# Patient Record
Sex: Female | Born: 1995 | Race: Black or African American | Hispanic: No | Marital: Single | Smoking: Never smoker
Health system: Southern US, Community
[De-identification: ages and names within clinical notes are randomized; demographics above are authoritative.]

## PROBLEM LIST (undated history)

## (undated) ENCOUNTER — Ambulatory Visit (HOSPITAL_COMMUNITY): Discharge status: Absent without leave | Payer: BLUE CROSS/BLUE SHIELD

## (undated) DIAGNOSIS — F32A Depression, unspecified: Secondary | ICD-10-CM

## (undated) HISTORY — PX: NO PAST SURGERIES: SHX2092

---

## 2018-04-29 ENCOUNTER — Encounter: Payer: Self-pay | Admitting: Endocrinology

## 2019-02-20 ENCOUNTER — Other Ambulatory Visit: Payer: Self-pay

## 2019-02-20 DIAGNOSIS — Z20822 Contact with and (suspected) exposure to covid-19: Secondary | ICD-10-CM

## 2019-02-21 LAB — NOVEL CORONAVIRUS, NAA: SARS-CoV-2, NAA: NOT DETECTED

## 2019-03-13 ENCOUNTER — Other Ambulatory Visit: Payer: Self-pay

## 2019-03-13 DIAGNOSIS — Z20822 Contact with and (suspected) exposure to covid-19: Secondary | ICD-10-CM

## 2019-03-14 LAB — NOVEL CORONAVIRUS, NAA: SARS-CoV-2, NAA: NOT DETECTED

## 2019-09-21 ENCOUNTER — Ambulatory Visit (HOSPITAL_COMMUNITY)
Admission: EM | Admit: 2019-09-21 | Discharge: 2019-09-21 | Disposition: A | Discharge status: Home / Self Care | Payer: BLUE CROSS/BLUE SHIELD

## 2019-09-21 ENCOUNTER — Encounter (HOSPITAL_COMMUNITY): Payer: Self-pay

## 2019-09-21 ENCOUNTER — Other Ambulatory Visit: Payer: Self-pay

## 2019-09-21 DIAGNOSIS — X501XXA Overexertion from prolonged static or awkward postures, initial encounter: Secondary | ICD-10-CM | POA: Diagnosis not present

## 2019-09-21 DIAGNOSIS — S92355A Nondisplaced fracture of fifth metatarsal bone, left foot, initial encounter for closed fracture: Secondary | ICD-10-CM

## 2019-09-21 DIAGNOSIS — Y939 Activity, unspecified: Secondary | ICD-10-CM | POA: Diagnosis not present

## 2019-09-21 MED ORDER — IBUPROFEN 800 MG PO TABS
800.0000 mg | ORAL_TABLET | Freq: Once | ORAL | Status: AC
  Administered 2019-09-21: 800 mg via ORAL

## 2019-09-21 MED ORDER — ACETAMINOPHEN 325 MG PO TABS: 975.0000 mg | ORAL_TABLET | Freq: Once | ORAL | Status: DC

## 2019-09-21 MED ORDER — IBUPROFEN 800 MG PO TABS
ORAL_TABLET | ORAL | Status: AC
  Filled 2019-09-21: qty 1

## 2019-09-21 MED ORDER — ACETAMINOPHEN 325 MG PO TABS
ORAL_TABLET | ORAL | Status: AC
  Filled 2019-09-21: qty 3

## 2019-09-21 MED ORDER — ACETAMINOPHEN 325 MG PO TABS
975.0000 mg | ORAL_TABLET | Freq: Once | ORAL | Status: AC
  Administered 2019-09-21: 975 mg via ORAL

## 2019-09-21 MED ORDER — ACETAMINOPHEN 500 MG PO TABS: 1000.0000 mg | ORAL_TABLET | Freq: Three times a day (TID) | ORAL | 0 refills | Status: DC | PRN

## 2019-09-21 MED ORDER — IBUPROFEN 800 MG PO TABS: 800.0000 mg | ORAL_TABLET | Freq: Once | ORAL | Status: DC

## 2019-09-21 NOTE — Discharge Instructions (Signed)
Please use the crutches at all time and wear the splint until you see the orthopedic group  Please follow-up with the [facility] group that your school gave you or the attached group.  Take Tylenol for your pain as prescribed.

## 2019-09-21 NOTE — ED Triage Notes (Signed)
Pt is here with a left foot injury that happened yesterday. States she was exercising at a park & she landed wrong on her foot, this morning she could not bare any weight on it.

## 2019-09-21 NOTE — ED Provider Notes (Signed)
[facility]    CSN: [id number] Arrival date & time: 09/21/19  1204      History   Chief Complaint Chief Complaint  Patient presents with  . Foot Injury    Left    HPI Krista Schroeder is a 24 y.o. female.   Patient presents to urgent care for acute pain in her left foot after a fall yesterday.  She reports she was working out walking down some steps and rolled her left ankle.  She had immediate pain on the outside of her foot.  She went to her school health clinic where she had x-rays and was not notified of fracture until today.  Patient's not been on to bear weight since yesterday.  School clinic was called and was able to discuss radiologic findings with x-ray technician on was notified of nondisplaced transverse fracture of the proximal fifth metatarsal.     History reviewed. No pertinent past medical history.  There are no problems to display for this patient.   History reviewed. No pertinent surgical history.  OB History   No obstetric history on file.      Home Medications    Prior to Admission medications   Medication Sig Start Date End Date Taking? Authorizing Provider  sertraline (ZOLOFT) 50 MG tablet Take 50 mg by mouth daily.   Yes [provider]  acetaminophen (TYLENOL) 500 MG tablet Take 2 tablets (1,000 mg total) by mouth every 8 (eight) hours as needed for mild pain or moderate pain. 09/21/19   Ricki Vanhandel, Veryl Speak, PA-C    Family History Family History  Problem Relation Age of Onset  . Healthy Mother   . Diabetes Father     Social History Social History   Tobacco Use  . Smoking status: Never Smoker  . Smokeless tobacco: Never Used  Substance Use Topics  . Alcohol use: Never  . Drug use: Never     Allergies   Patient has no known allergies.   Review of Systems Review of Systems  Musculoskeletal: Positive for gait problem.       Foot pain see HPI     Physical Exam Triage Vital Signs ED Triage Vitals  Enc Vitals  Group     BP 09/21/19 1246 102/69     Pulse Rate 09/21/19 1246 70     Resp 09/21/19 1246 17     Temp 09/21/19 1246 98.8 F (37.1 C)     Temp Source 09/21/19 1246 Oral     SpO2 09/21/19 1246 100 %     Weight --      Height --      Head Circumference --      Peak Flow --      Pain Score 09/21/19 1247 10     Pain Loc --      Pain Edu? --      Excl. in GC? --    No data found.  Updated Vital Signs BP 102/69 (BP Location: Right Arm)   Pulse 70   Temp 98.8 F (37.1 C) (Oral)   Resp 17   LMP 08/29/2019 (Exact Date)   SpO2 100%   Visual Acuity Right Eye Distance:   Left Eye Distance:   Bilateral Distance:    Right Eye Near:   Left Eye Near:    Bilateral Near:     Physical Exam Vitals and nursing note reviewed.  Constitutional:      General: She is not in acute distress.  Appearance: She is well-developed.  HENT:     Head: Normocephalic and atraumatic.  Eyes:     Conjunctiva/sclera: Conjunctivae normal.  Cardiovascular:     Rate and Rhythm: Normal rate.  Pulmonary:     Effort: Pulmonary effort is normal. No respiratory distress.  Musculoskeletal:     Cervical back: Neck supple.     Comments: Left foot - visible swelling ecchymosis over proximal fifth metatarsal with tenderness.  No malleoli  tenderness or swelling  Skin:    General: Skin is warm and dry.     Capillary Refill: Capillary refill takes less than 2 seconds.  Neurological:     Mental Status: She is alert.     Comments: Sensation intact distal to injury in digits of left foot      UC Treatments / Results  Labs (all labs ordered are listed, but only abnormal results are displayed) Labs Reviewed - No data to display  EKG   Radiology No results found.  Procedures Procedures (including critical care time)  Medications Ordered in UC Medications  acetaminophen (TYLENOL) tablet 975 mg (975 mg Oral Given 09/21/19 1420)  ibuprofen (ADVIL) tablet 800 mg (800 mg Oral Given 09/21/19 1420)     Initial Impression / Assessment and Plan / UC Course  I have reviewed the triage vital signs and the nursing notes.  Pertinent labs & imaging results that were available during my care of the patient were reviewed by me and considered in my medical decision making (see chart for details).      #Nondisplaced fracture of proximal fifth metatarsal Patient is a 24 year old female presenting with acute left foot pain.  I was able to speak with school clinic and confirm x-ray findings from yesterday of nondisplaced fracture of fifth metatarsal.  Patient given acetaminophen and ibuprofen in clinic for pain management.  She was splinted in a posterior splint and placed on crutches for nonweightbearing.  Instructed patient to follow-up with orthopedic clinic recommended by her school clinic or option supplied by this clinic.  She agrees and will call today.  Recommended Tylenol and ibuprofen for pain management. -Remain nonweightbearing -Tylenol and ibuprofen for pain management Final Clinical Impressions(s) / UC Diagnoses   Final diagnoses:  Closed nondisplaced fracture of fifth metatarsal bone of left foot, initial encounter     Discharge Instructions     Please use the crutches at all time and wear the splint until you see the orthopedic group  Please follow-up with the [facility] group that your school gave you or the attached group.  Take Tylenol for your pain as prescribed.    ED Prescriptions    Medication Sig Dispense Auth. Provider   acetaminophen (TYLENOL) 500 MG tablet Take 2 tablets (1,000 mg total) by mouth every 8 (eight) hours as needed for mild pain or moderate pain. 30 tablet Quantavia Frith, Marguerita Beards, PA-C     PDMP not reviewed this encounter.   Purnell Shoemaker, PA-C 09/21/19 1553

## 2019-09-21 NOTE — Progress Notes (Signed)
Orthopedic Tech Progress Note Patient Details:  Krista Schroeder 12-20-1995 [id number]  Ortho Devices Type of Ortho Device: Crutches, Short leg splint Ortho Device/Splint Location: LLE Ortho Device/Splint Interventions: Ordered, Application   Post Interventions Patient Tolerated: Well, Ambulated well Instructions Provided: Care of device, Adjustment of device   Donald Pore 09/21/2019, 2:34 PM

## 2019-10-07 ENCOUNTER — Ambulatory Visit: Payer: BLUE CROSS/BLUE SHIELD | Attending: Internal Medicine

## 2019-10-14 ENCOUNTER — Ambulatory Visit: Payer: BLUE CROSS/BLUE SHIELD | Attending: Internal Medicine

## 2019-10-16 ENCOUNTER — Ambulatory Visit: Payer: BLUE CROSS/BLUE SHIELD | Attending: Family

## 2019-10-16 DIAGNOSIS — Z23 Encounter for immunization: Secondary | ICD-10-CM

## 2019-10-16 NOTE — Progress Notes (Signed)
   Covid-19 Vaccination Clinic  Name:  Krista Schroeder    MRN: [id number] DOB: 06-Apr-1996  10/16/2019  [name] was observed post Covid-19 immunization for 15 minutes without incident. She was provided with Vaccine Information Sheet and instruction to access the V-Safe system.   [name] was instructed to call 911 with any severe reactions post vaccine: Marland Kitchen Difficulty breathing  . Swelling of face and throat  . A fast heartbeat  . A bad rash all over body  . Dizziness and weakness   Immunizations Administered    Name Date Dose VIS Date Route   JANSSEN COVID-19 VACCINE 10/16/2019 11:04 AM 0.5 mL 09/11/2019 Intramuscular   Manufacturer: Linwood Dibbles   Lot: [id number]   NDC: 30097-949-97

## 2020-03-08 ENCOUNTER — Encounter (HOSPITAL_BASED_OUTPATIENT_CLINIC_OR_DEPARTMENT_OTHER): Payer: Self-pay | Admitting: Emergency Medicine

## 2020-03-08 ENCOUNTER — Emergency Department (HOSPITAL_BASED_OUTPATIENT_CLINIC_OR_DEPARTMENT_OTHER)
Admission: EM | Admit: 2020-03-08 | Discharge: 2020-03-08 | Disposition: A | Discharge status: Home / Self Care | Payer: BLUE CROSS/BLUE SHIELD | Attending: Emergency Medicine | Admitting: Emergency Medicine

## 2020-03-08 ENCOUNTER — Other Ambulatory Visit: Payer: Self-pay

## 2020-03-08 ENCOUNTER — Emergency Department (HOSPITAL_BASED_OUTPATIENT_CLINIC_OR_DEPARTMENT_OTHER): Payer: BLUE CROSS/BLUE SHIELD

## 2020-03-08 DIAGNOSIS — Y9389 Activity, other specified: Secondary | ICD-10-CM | POA: Insufficient documentation

## 2020-03-08 DIAGNOSIS — Y998 Other external cause status: Secondary | ICD-10-CM | POA: Diagnosis not present

## 2020-03-08 DIAGNOSIS — S59901A Unspecified injury of right elbow, initial encounter: Secondary | ICD-10-CM | POA: Insufficient documentation

## 2020-03-08 DIAGNOSIS — W1789XA Other fall from one level to another, initial encounter: Secondary | ICD-10-CM | POA: Diagnosis not present

## 2020-03-08 DIAGNOSIS — Y92811 Bus as the place of occurrence of the external cause: Secondary | ICD-10-CM | POA: Diagnosis not present

## 2020-03-08 DIAGNOSIS — M25521 Pain in right elbow: Secondary | ICD-10-CM

## 2020-03-08 MED ORDER — ACETAMINOPHEN 500 MG PO TABS
1000.0000 mg | ORAL_TABLET | Freq: Once | ORAL | Status: AC
  Administered 2020-03-08: 1000 mg via ORAL
  Filled 2020-03-08: qty 2

## 2020-03-08 MED ORDER — OXYCODONE HCL 5 MG PO TABS
5.0000 mg | ORAL_TABLET | Freq: Once | ORAL | Status: AC
  Administered 2020-03-08: 5 mg via ORAL
  Filled 2020-03-08: qty 1

## 2020-03-08 NOTE — ED Provider Notes (Signed)
[facility] EMERGENCY DEPARTMENT Provider Note   CSN: [id number] Arrival date & time: 03/08/20  0831     History Chief Complaint  Patient presents with  . Arm Injury    Krista Schroeder is a 24 y.o. female.  24 yo F with a chief complaints of right arm pain.  The patient was getting off a bus and she lost her balance and fell onto a flexed arm at the elbow.  She is now having pain from the elbow down to the wrist.  She denies head injury denies loss consciousness denies neck pain chest pain abdominal pain.  She denies pain in the shoulder or the upper arm.  The history is provided by the patient.  Arm Injury Location:  Elbow Elbow location:  R elbow Injury: yes   Time since incident:  2 days Pain details:    Quality:  Aching   Radiates to:  Does not radiate   Severity:  Moderate   Onset quality:  Sudden   Duration:  1 hour   Timing:  Constant   Progression:  Worsening Handedness:  Right-handed Prior injury to area:  No Relieved by:  Nothing Worsened by:  Bearing weight, exercise and movement Ineffective treatments:  None tried Associated symptoms: no fever        History reviewed. No pertinent past medical history.  There are no problems to display for this patient.   History reviewed. No pertinent surgical history.   OB History   No obstetric history on file.     Family History  Problem Relation Age of Onset  . Healthy Mother   . Diabetes Father     Social History   Tobacco Use  . Smoking status: Never Smoker  . Smokeless tobacco: Never Used  Substance Use Topics  . Alcohol use: Never  . Drug use: Never    Home Medications Prior to Admission medications   Not on File    Allergies    Patient has no known allergies.  Review of Systems   Review of Systems  Constitutional: Negative for chills and fever.  HENT: Negative for congestion and rhinorrhea.   Eyes: Negative for redness and visual disturbance.  Respiratory: Negative for  shortness of breath and wheezing.   Cardiovascular: Negative for chest pain and palpitations.  Gastrointestinal: Negative for nausea and vomiting.  Genitourinary: Negative for dysuria and urgency.  Musculoskeletal: Positive for arthralgias and myalgias.  Skin: Negative for pallor and wound.  Neurological: Negative for dizziness and headaches.    Physical Exam Updated Vital Signs BP 112/83 (BP Location: Left Arm)   Pulse 77   Temp 98.1 F (36.7 C) (Oral)   Resp 16   Ht 5\' 3"  (1.6 m)   Wt 81.6 kg   LMP 01/26/2020   SpO2 99%   BMI 31.89 kg/m   Physical Exam Vitals and nursing note reviewed.  Constitutional:      General: She is not in acute distress.    Appearance: She is well-developed. She is not diaphoretic.  HENT:     Head: Normocephalic and atraumatic.  Eyes:     Pupils: Pupils are equal, round, and reactive to light.  Cardiovascular:     Rate and Rhythm: Normal rate and regular rhythm.     Heart sounds: No murmur heard.  No friction rub. No gallop.   Pulmonary:     Effort: Pulmonary effort is normal.     Breath sounds: No wheezing or rales.  Abdominal:  General: There is no distension.     Palpations: Abdomen is soft.     Tenderness: There is no abdominal tenderness.  Musculoskeletal:        General: Tenderness present.     Cervical back: Normal range of motion and neck supple.     Comments: No obvious signs of injury to the right arm.  She does have tenderness from the lateral epicondyle down to the wrist.  No break in the skin.  Pulse motor and sensation intact distally.  No pain at the shoulder or the clavicle or humerus.  Palpated from head to toe without any other noted areas of bony tenderness.  Skin:    General: Skin is warm and dry.  Neurological:     Mental Status: She is alert and oriented to person, place, and time.  Psychiatric:        Behavior: Behavior normal.     ED Results / Procedures / Treatments   Labs (all labs ordered are listed,  but only abnormal results are displayed) Labs Reviewed - No data to display  EKG None  Radiology DG Elbow Complete Right  Result Date: 03/08/2020 CLINICAL DATA:  Right elbow pain after fall EXAM: RIGHT ELBOW - COMPLETE 3+ VIEW COMPARISON:  None. FINDINGS: There is no evidence of fracture, dislocation, or joint effusion. There is no evidence of arthropathy or other focal bone abnormality. Soft tissues are unremarkable. IMPRESSION: Negative. Electronically Signed   By: Marnee Spring M.D.   On: 03/08/2020 09:10   DG Forearm Right  Result Date: 03/08/2020 CLINICAL DATA:  Radiating elbow and forearm pain after injury. EXAM: RIGHT FOREARM - 2 VIEW COMPARISON:  None. FINDINGS: There is no evidence of fracture or other focal bone lesions. Soft tissues are unremarkable. IMPRESSION: Negative. Electronically Signed   By: Marnee Spring M.D.   On: 03/08/2020 09:11    Procedures Procedures (including critical care time)  Medications Ordered in ED Medications  acetaminophen (TYLENOL) tablet 1,000 mg (1,000 mg Oral Given 03/08/20 0852)  oxyCODONE (Oxy IR/ROXICODONE) immediate release tablet 5 mg (5 mg Oral Given 03/08/20 7253)    ED Course  I have reviewed the triage vital signs and the nursing notes.  Pertinent labs & imaging results that were available during my care of the patient were reviewed by me and considered in my medical decision making (see chart for details).    MDM Rules/Calculators/A&P                          24 yo F with a chief complaints of a fall.  Nonsyncopal by history.  Patient complaining only of right elbow pain that radiates down to the wrist.  Will obtain a plain film of the right elbow and forearm.  Plain film viewed by me without fracture.  Patient was already given a sling.  We will have her follow-up with her family doctor.  9:16 AM:  I have discussed the diagnosis/risks/treatment options with the patient and believe the pt to be eligible for discharge home to  follow-up with PCP. We also discussed returning to the ED immediately if new or worsening sx occur. We discussed the sx which are most concerning (e.g., sudden worsening pain, fever, inability to tolerate by mouth) that necessitate immediate return. Medications administered to the patient during their visit and any new prescriptions provided to the patient are listed below.  Medications given during this visit Medications  acetaminophen (TYLENOL) tablet 1,000 mg (1,000  mg Oral Given 03/08/20 0852)  oxyCODONE (Oxy IR/ROXICODONE) immediate release tablet 5 mg (5 mg Oral Given 03/08/20 3300)     The patient appears reasonably screen and/or stabilized for discharge and I doubt any other medical condition or other [facility] requiring further screening, evaluation, or treatment in the ED at this time prior to discharge.   Final Clinical Impression(s) / ED Diagnoses Final diagnoses:  Right elbow pain    Rx / DC Orders ED Discharge Orders    None       Melene Plan, DO 03/08/20 [phone]

## 2020-03-08 NOTE — ED Triage Notes (Signed)
Pt fell while getting out of a bus, falling onto right elbow.  Pain from elbow to 5th finger.

## 2020-03-08 NOTE — ED Notes (Signed)
ED Provider at bedside. 

## 2020-03-08 NOTE — Discharge Instructions (Signed)
The x-ray of your right arm did not show fracture.  Please follow-up with your family doctor in the office.  You can release the sling for comfort but should take your arm out of the sling at least 4 times a day.  Take 4 over the counter ibuprofen tablets 3 times a day or 2 over-the-counter naproxen tablets twice a day for pain. Also take tylenol 1000mg (2 extra strength) four times a day.

## 2020-08-15 ENCOUNTER — Ambulatory Visit: Payer: BLUE CROSS/BLUE SHIELD | Attending: Internal Medicine

## 2020-08-15 DIAGNOSIS — Z23 Encounter for immunization: Secondary | ICD-10-CM

## 2020-08-15 NOTE — Progress Notes (Signed)
   Covid-19 Vaccination Clinic  Name:  Krista Schroeder    MRN: [id number] DOB: [birth year]  08/15/2020  Krista Schroeder was observed post Covid-19 immunization for 15 minutes without incident. She was provided with Vaccine Information Sheet and instruction to access the V-Safe system.   Krista Schroeder was instructed to call 911 with any severe reactions post vaccine: Marland Kitchen Difficulty breathing  . Swelling of face and throat  . A fast heartbeat  . A bad rash all over body  . Dizziness and weakness   Immunizations Administered    Name Date Dose VIS Date Route   Moderna Covid-19 Booster Vaccine 08/15/2020  1:50 PM 0.25 mL 05/03/2020 Intramuscular   Manufacturer: Gala Murdoch   Lot: [id number]   NDC: 96886-484-72

## 2020-10-05 ENCOUNTER — Other Ambulatory Visit: Payer: Self-pay

## 2020-10-05 ENCOUNTER — Emergency Department (HOSPITAL_COMMUNITY)
Admission: EM | Admit: 2020-10-05 | Discharge: 2020-10-05 | Disposition: A | Discharge status: Other Acute Inpatient Hospital | Payer: BLUE CROSS/BLUE SHIELD | Source: Home / Self Care | Attending: Emergency Medicine | Admitting: Emergency Medicine

## 2020-10-05 ENCOUNTER — Other Ambulatory Visit: Payer: Self-pay | Admitting: Psychiatry

## 2020-10-05 ENCOUNTER — Encounter (HOSPITAL_COMMUNITY): Payer: Self-pay

## 2020-10-05 DIAGNOSIS — Z20822 Contact with and (suspected) exposure to covid-19: Secondary | ICD-10-CM | POA: Insufficient documentation

## 2020-10-05 DIAGNOSIS — F332 Major depressive disorder, recurrent severe without psychotic features: Secondary | ICD-10-CM | POA: Diagnosis not present

## 2020-10-05 DIAGNOSIS — S61511A Laceration without foreign body of right wrist, initial encounter: Secondary | ICD-10-CM | POA: Insufficient documentation

## 2020-10-05 DIAGNOSIS — Z7289 Other problems related to lifestyle: Secondary | ICD-10-CM

## 2020-10-05 DIAGNOSIS — X781XXA Intentional self-harm by knife, initial encounter: Secondary | ICD-10-CM | POA: Insufficient documentation

## 2020-10-05 DIAGNOSIS — R45851 Suicidal ideations: Secondary | ICD-10-CM

## 2020-10-05 HISTORY — DX: Depression, unspecified: F32.A

## 2020-10-05 LAB — SALICYLATE LEVEL: Salicylate Lvl: <7 mg/dL — ABNORMAL LOW (ref 7.0–30.0)

## 2020-10-05 LAB — COMPREHENSIVE METABOLIC PANEL
ALT: 19 U/L (ref 0–44)
AST: 29 U/L (ref 15–41)
Albumin: 3.9 g/dL (ref 3.5–5.0)
Alkaline Phosphatase: 73 U/L (ref 38–126)
Anion gap: 8 (ref 5–15)
BUN: 7 mg/dL (ref 6–20)
CO2: 22 mmol/L (ref 22–32)
Calcium: 9.1 mg/dL (ref 8.9–10.3)
Chloride: 110 mmol/L (ref 98–111)
Creatinine, Ser: 0.74 mg/dL (ref 0.44–1.00)
GFR, Estimated: >60 mL/min (ref >60)
Glucose, Bld: 101 mg/dL — ABNORMAL HIGH (ref 70–99)
Potassium: 4 mmol/L (ref 3.5–5.1)
Sodium: 140 mmol/L (ref 135–145)
Total Bilirubin: 0.8 mg/dL (ref 0.3–1.2)
Total Protein: 7.7 g/dL (ref 6.5–8.1)

## 2020-10-05 LAB — RAPID URINE DRUG SCREEN, HOSP PERFORMED
Amphetamines: NOT DETECTED
Barbiturates: NOT DETECTED
Benzodiazepines: NOT DETECTED
Cocaine: NOT DETECTED
Opiates: NOT DETECTED
Tetrahydrocannabinol: NOT DETECTED

## 2020-10-05 LAB — CBC
HCT: 37.6 % (ref 36.0–46.0)
Hemoglobin: 12.2 g/dL (ref 12.0–15.0)
MCH: 27.7 pg (ref 26.0–34.0)
MCHC: 32.4 g/dL (ref 30.0–36.0)
MCV: 85.3 fL (ref 80.0–100.0)
Platelets: 310 10*3/uL (ref 150–400)
RBC: 4.41 MIL/uL (ref 3.87–5.11)
RDW: 13.1 % (ref 11.5–15.5)
WBC: 4 10*3/uL (ref 4.0–10.5)
nRBC: 0 % (ref 0.0–0.2)

## 2020-10-05 LAB — RESP PANEL BY RT-PCR (FLU A&B, COVID) ARPGX2
Influenza A by PCR: NEGATIVE
Influenza B by PCR: NEGATIVE
SARS Coronavirus 2 by RT PCR: NEGATIVE

## 2020-10-05 LAB — PREGNANCY, URINE: Preg Test, Ur: NEGATIVE

## 2020-10-05 LAB — ACETAMINOPHEN LEVEL: Acetaminophen (Tylenol), Serum: <10 ug/mL — ABNORMAL LOW (ref 10–30)

## 2020-10-05 LAB — ETHANOL: Alcohol, Ethyl (B): <10 mg/dL (ref <10)

## 2020-10-05 NOTE — ED Notes (Signed)
2 patient belonging bags placed behind Triage nursing station.

## 2020-10-05 NOTE — ED Triage Notes (Signed)
Patient states she is suicidal and had a knife last night. Patient has a superficial mark to the right inner wrist and states she attempted to kill herself last night. Patient tearful in triage. Patient denies HI, auditory or visual hallucinations. Patient denies drug or alcohol use.

## 2020-10-05 NOTE — BH Assessment (Signed)
Per Rosezetta Schlatter , RN , [facility] accepted patient to [id number] per Dr Jola Babinski . Arrival time TBD by night time AC. Please wait for instructions from night time [facility]  before calling report.
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
## 2020-10-05 NOTE — BH Assessment (Signed)
Writer informed the nurse that the patient is able to come to [facility] at 11pm.   Per Rosezetta Schlatter , RN , [facility] at [facility] accepted patient to [phone] per Dr Jola Babinski .

## 2020-10-05 NOTE — ED Provider Notes (Addendum)
[facility]-EMERGENC[facility] DEPT Provider Note   CSN: [id number] Arrival date & time: 10/05/20  1230     History Chief Complaint  Patient presents with  . Suicidal    Krista Schroeder is a 25 y.o. female presenting for evaluation of SI.   Pt states she is having suicidal thoughts. She cut her R wrist with a knife last night due to this.  She has not had anything else in attempt to hurt herself.  The suicidal thoughts are new for her.  She denies homicidal thoughts or audiovisual hallucinations.  She does see a therapist due to depression, has been off now she is felt, but does not take any medication for this.  She has never had suicidal thoughts like this before.  She denies tobacco, alcohol, or drug use.  She is no other medical problems takes no medications daily. Her friend encouraged her to come to the ED today.   Additional history obtained from chart review.  Per chart review, patient with a history of depression.   HPI     Past Medical History:  Diagnosis Date  . Depression     There are no problems to display for this patient.   History reviewed. No pertinent surgical history.   OB History   No obstetric history on file.     Family History  Problem Relation Age of Onset  . Healthy Mother   . Diabetes Father     Social History   Tobacco Use  . Smoking status: Never Smoker  . Smokeless tobacco: Never Used  Vaping Use  . Vaping Use: Never used  Substance Use Topics  . Alcohol use: Never  . Drug use: Never    Home Medications Prior to Admission medications   Not on File    Allergies    Patient has no known allergies.  Review of Systems   Review of Systems  Psychiatric/Behavioral: Positive for dysphoric mood, self-injury and suicidal ideas.  All other systems reviewed and are negative.   Physical Exam Updated Vital Signs BP 121/87 (BP Location: Left Arm)   Pulse 78   Temp 98.7 F (37.1 C) (Oral)   Resp 18   Ht 5\' 3"  (1.6  m)   Wt 90.7 kg   LMP 09/27/2020   SpO2 99%   BMI 35.43 kg/m   Physical Exam Vitals and nursing note reviewed.  Constitutional:      General: She is not in acute distress.    Appearance: She is well-developed.     Comments: Appears nontoxic  HENT:     Head: Normocephalic and atraumatic.  Eyes:     Conjunctiva/sclera: Conjunctivae normal.     Pupils: Pupils are equal, round, and reactive to light.  Cardiovascular:     Rate and Rhythm: Normal rate and regular rhythm.     Pulses: Normal pulses.  Pulmonary:     Effort: Pulmonary effort is normal. No respiratory distress.     Breath sounds: Normal breath sounds. No wheezing.  Abdominal:     General: There is no distension.     Palpations: Abdomen is soft. There is no mass.     Tenderness: There is no abdominal tenderness. There is no guarding or rebound.  Musculoskeletal:        General: Normal range of motion.     Cervical back: Normal range of motion and neck supple.  Skin:    General: Skin is warm and dry.     Capillary Refill: Capillary  refill takes less than 2 seconds.     Comments: 3 very superficial lacerations of the right wrist  Neurological:     Mental Status: She is alert and oriented to person, place, and time.  Psychiatric:        Attention and Perception: She does not perceive auditory or visual hallucinations.        Mood and Affect: Mood is depressed. Affect is tearful.        Speech: She is noncommunicative.        Behavior: Behavior is withdrawn.        Thought Content: Thought content includes suicidal ideation. Thought content does not include homicidal ideation. Thought content does not include homicidal plan.     Comments: Pt withdrawn and with tearful affect. Reports SI     ED Results / Procedures / Treatments   Labs (all labs ordered are listed, but only abnormal results are displayed) Labs Reviewed  COMPREHENSIVE METABOLIC PANEL - Abnormal; Notable for the following components:      Result Value    Glucose, Bld 101 (*)    All other components within normal limits  SALICYLATE LEVEL - Abnormal; Notable for the following components:   Salicylate Lvl <7.0 (*)    All other components within normal limits  ACETAMINOPHEN LEVEL - Abnormal; Notable for the following components:   Acetaminophen (Tylenol), Serum <10 (*)    All other components within normal limits  RESP PANEL BY RT-PCR (FLU A&B, COVID) ARPGX2  ETHANOL  CBC  RAPID URINE DRUG SCREEN, HOSP PERFORMED  I-STAT BETA HCG BLOOD, ED ([facility], [facility], [facility] ONLY)    EKG None  Radiology No results found.  Procedures Procedures   Medications Ordered in ED Medications - No data to display  ED Course  I have reviewed the triage vital signs and the nursing notes.  Pertinent labs & imaging results that were available during my care of the patient were reviewed by me and considered in my medical decision making (see chart for details).    MDM Rules/Calculators/A&P                          Patient presenting for suicidal thoughts.  On exam, patient appears nontoxic.  She is withdrawn and has tearful affect.  She does report attempted herself last night which is the first time she has done so by cutting her right wrist with a knife.  On evaluation, patient with very superficial wounds of the right volar wrist, does not require repair.  No other reported attempt to hurt herself.  At this time, patient is medically cleared for psychiatric evaluation, as she is having new/worsening SI with self-harm. She is not currently under IVC, however if pt tries to leave she will need IVC until psych team can evaluate.   The patient has been placed in psychiatric observation due to the need to provide a safe environment for the patient while obtaining psychiatric consultation and evaluation, as well as ongoing medical and medication management to treat the patient's condition.  The patient has not been placed under full IVC at this time.  [facility] team evaluated  pt, recommends inpatient tx. No home meds to order.    Final Clinical Impression(s) / ED Diagnoses Final diagnoses:  Suicidal ideation  Deliberate self-cutting    Rx / DC Orders ED Discharge Orders    None       Caccavale, Sophia, PA-C 10/05/20 1428    Caccavale,  Jeanette Caprice, PA-C 10/05/20 1513    Mancel Bale, MD 10/05/20 1600

## 2020-10-05 NOTE — ED Notes (Signed)
Transportation arranged with [facility].  

## 2020-10-06 ENCOUNTER — Inpatient Hospital Stay (HOSPITAL_COMMUNITY)
Admission: AD | Admit: 2020-10-06 | Discharge: 2020-10-09 | DRG: 885 | Disposition: A | Discharge status: Home / Self Care | Payer: BLUE CROSS/BLUE SHIELD | Source: Intra-hospital | Attending: Behavioral Health | Admitting: Behavioral Health

## 2020-10-06 ENCOUNTER — Encounter (HOSPITAL_COMMUNITY): Payer: Self-pay | Admitting: Psychiatry

## 2020-10-06 DIAGNOSIS — Z20822 Contact with and (suspected) exposure to covid-19: Secondary | ICD-10-CM | POA: Diagnosis present

## 2020-10-06 DIAGNOSIS — F332 Major depressive disorder, recurrent severe without psychotic features: Secondary | ICD-10-CM | POA: Diagnosis present

## 2020-10-06 DIAGNOSIS — G4700 Insomnia, unspecified: Secondary | ICD-10-CM | POA: Diagnosis present

## 2020-10-06 DIAGNOSIS — F339 Major depressive disorder, recurrent, unspecified: Secondary | ICD-10-CM | POA: Diagnosis present

## 2020-10-06 MED ORDER — HYDROXYZINE HCL 25 MG PO TABS
25.0000 mg | ORAL_TABLET | Freq: Three times a day (TID) | ORAL | Status: DC | PRN
  Administered 2020-10-06 – 2020-10-08 (×6): 25 mg via ORAL
  Filled 2020-10-06 (×6): qty 1

## 2020-10-06 MED ORDER — TRAZODONE HCL 50 MG PO TABS
50.0000 mg | ORAL_TABLET | Freq: Every evening | ORAL | Status: DC | PRN
  Administered 2020-10-06 – 2020-10-08 (×4): 50 mg via ORAL
  Filled 2020-10-06 (×4): qty 1

## 2020-10-06 MED ORDER — ESCITALOPRAM OXALATE 10 MG PO TABS
10.0000 mg | ORAL_TABLET | Freq: Every day | ORAL | Status: DC
  Administered 2020-10-06 – 2020-10-09 (×4): 10 mg via ORAL
  Filled 2020-10-06 (×7): qty 1

## 2020-10-06 NOTE — Progress Notes (Addendum)
Pt concerned about her school situation, pt said she was feeling better overall. 1:1 time spent with writer discussing possible things to do moving forward. Pt stated she feels more hopeful moving forward. Pt given PRN Vistaril and Trazodone per [facility]     10/06/20 2200  Psych Admission Type (Psych Patients Only)  Admission Status Voluntary  Psychosocial Assessment  Patient Complaints Anxiety;Depression  Eye Contact Fair  Facial Expression Anxious;Sullen;Sad;Worried  Affect Anxious;Depressed;Sad;Sullen  Speech Logical/coherent  Motor Activity Fidgety  Appearance/Hygiene Unremarkable  Behavior Characteristics Cooperative  Mood Depressed;Anxious  Thought Process  Coherency WDL  Content Blaming others  Delusions None reported or observed  Perception WDL  Hallucination None reported or observed  Judgment Poor  Confusion None  Danger to Self  Current suicidal ideation? Denies  Danger to Others  Danger to Others None reported or observed

## 2020-10-06 NOTE — Progress Notes (Signed)
Progress note    10/06/20 1020  Psych Admission Type (Psych Patients Only)  Admission Status Voluntary  Psychosocial Assessment  Patient Complaints Anxiety;Depression  Eye Contact Fair  Facial Expression Anxious;Sullen;Sad;Worried  Affect Anxious;Depressed;Sad;Sullen  Speech Logical/coherent  Motor Activity Fidgety  Appearance/Hygiene Unremarkable  Behavior Characteristics Cooperative;Appropriate to situation;Anxious  Mood Depressed;Anxious;Sad;Sullen;Pleasant  Thought Process  Coherency WDL  Content Blaming others  Delusions None reported or observed  Perception WDL  Hallucination None reported or observed  Judgment Poor  Confusion None  Danger to Self  Current suicidal ideation? Denies  Danger to Others  Danger to Others None reported or observed

## 2020-10-06 NOTE — Tx Team (Signed)
Interdisciplinary Treatment and Diagnostic Plan Update  10/06/2020 Time of Session: 9:35am Krista Schroeder MRN: [id number]  Principal Diagnosis: <principal problem not specified>  Secondary Diagnoses: Active Problems:   MDD (major depressive disorder), recurrent episode, severe (HCC)   Current Medications:  Current Facility-Administered Medications  Medication Dose Route Frequency Provider Last Rate Last Admin  . hydrOXYzine (ATARAX/VISTARIL) tablet 25 mg  25 mg Oral TID PRN Lindon Romp A, NP   25 mg at 10/06/20 0048  . traZODone (DESYREL) tablet 50 mg  50 mg Oral QHS PRN Lindon Romp A, NP   50 mg at 10/06/20 0048   PTA Medications: No medications prior to admission.    Patient Stressors: Network engineer difficulties Loss of 2 cousins, grandmother, friend, & family friend Marital or family conflict  Patient Strengths: Capable of independent living Curator fund of knowledge Motivation for treatment/growth Physical Health Supportive family/friends  Treatment Modalities: Medication Management, Group therapy, Case management,  1 to 1 session with clinician, Psychoeducation, Recreational therapy.   Physician Treatment Plan for Primary Diagnosis: <principal problem not specified> Long Term Goal(s):     Short Term Goals:    Medication Management: Evaluate patient's response, side effects, and tolerance of medication regimen.  Therapeutic Interventions: 1 to 1 sessions, Unit Group sessions and Medication administration.  Evaluation of Outcomes: Not Met  Physician Treatment Plan for Secondary Diagnosis: Active Problems:   MDD (major depressive disorder), recurrent episode, severe ([place])  Long Term Goal(s):     Short Term Goals:       Medication Management: Evaluate patient's response, side effects, and tolerance of medication regimen.  Therapeutic Interventions: 1 to 1 sessions, Unit Group sessions and Medication  administration.  Evaluation of Outcomes: Not Met   RN Treatment Plan for Primary Diagnosis: <principal problem not specified> Long Term Goal(s): Knowledge of disease and therapeutic regimen to maintain health will improve  Short Term Goals: Ability to demonstrate self-control, Ability to participate in decision making will improve and Ability to verbalize feelings will improve  Medication Management: RN will administer medications as ordered by provider, will assess and evaluate patient's response and provide education to patient for prescribed medication. RN will report any adverse and/or side effects to prescribing provider.  Therapeutic Interventions: 1 on 1 counseling sessions, Psychoeducation, Medication administration, Evaluate responses to treatment, Monitor vital signs and CBGs as ordered, Perform/monitor CIWA, COWS, AIMS and Fall Risk screenings as ordered, Perform wound care treatments as ordered.  Evaluation of Outcomes: Not Met   LCSW Treatment Plan for Primary Diagnosis: <principal problem not specified> Long Term Goal(s): Safe transition to appropriate next level of care at discharge, Engage patient in therapeutic group addressing interpersonal concerns.  Short Term Goals: Engage patient in aftercare planning with referrals and resources, Increase social support and Increase ability to appropriately verbalize feelings  Therapeutic Interventions: Assess for all discharge needs, 1 to 1 time with Social worker, Explore available resources and support systems, Assess for adequacy in community support network, Educate family and significant other(s) on suicide prevention, Complete Psychosocial Assessment, Interpersonal group therapy.  Evaluation of Outcomes: Not Met   Progress in Treatment: Attending groups: No. and As evidenced by:  Pt was recently admitted Participating in groups: No. and As evidenced by:  Pt was recently admitted Taking medication as prescribed:  Yes. Toleration medication: Yes. Family/Significant other contact made: No, will contact:  CSW will obtain consent Patient understands diagnosis: Yes. Discussing patient identified problems/goals with staff: Yes. Medical problems stabilized or resolved: Yes.  Denies suicidal/homicidal ideation: Yes. Issues/concerns per patient self-inventory: No. Other: None  New problem(s) identified: No, Describe:  None  New Short Term/Long Term Goal(s):medication stabilization, elimination of SI thoughts, development of comprehensive mental wellness plan.  Patient Goals:  "taking action on coping skills"  Discharge Plan or Barriers: Patient recently admitted. CSW will continue to follow and assess for appropriate referrals and possible discharge planning.  Reason for Continuation of Hospitalization: Depression Medication stabilization Suicidal ideation  Estimated Length of Stay: 3-5 days  Attendees: Patient: Krista Schroeder 10/06/2020   Physician: Dr. Myles Lipps 10/06/2020   Nursing:  10/06/2020   RN Care Manager: 10/06/2020   Social Worker: Toney Reil, [place] 10/06/2020   Recreational Therapist:  10/06/2020   Other:  10/06/2020   Other:  10/06/2020   Other: 10/06/2020       Scribe for Treatment Team: Mliss Fritz, Latanya Presser 10/06/2020 11:37 AM

## 2020-10-06 NOTE — Progress Notes (Signed)
   10/05/20 2330  Psych Admission Type (Psych Patients Only)  Admission Status Voluntary  Psychosocial Assessment  Patient Complaints Anxiety;Appetite decrease;Depression;Hopelessness;Insomnia;Panic attack;Sadness;Self-harm thoughts  Eye Contact Fair  Facial Expression Anxious  Affect Anxious;Appropriate to circumstance  Speech Logical/coherent  Interaction Assertive  Motor Activity Other (Comment) (WNL)  Appearance/Hygiene Unremarkable  Behavior Characteristics Cooperative;Appropriate to situation;Anxious  Mood Depressed;Anxious;Sad;Pleasant  Thought Process  Coherency WDL  Content Blaming others  Delusions None reported or observed  Perception WDL  Hallucination None reported or observed  Judgment Poor  Confusion None  Danger to Self  Current suicidal ideation? Denies  Danger to Others  Danger to Others None reported or observed

## 2020-10-06 NOTE — Progress Notes (Signed)
   10/06/20 0500  Sleep  Number of Hours 5

## 2020-10-06 NOTE — H&P (Signed)
Psychiatric Admission Assessment Adult  Patient Identification: Krista Schroeder  MRN:  [id number]  Date of Evaluation:  10/06/2020  Chief Complaint:  MDD (major depressive disorder), recurrent episode, severe (HCC) [F33.2]  Principal Diagnosis: MDD (major depressive disorder), recurrent episode, severe (HCC)  Diagnosis:  Principal Problem:   MDD (major depressive disorder), recurrent episode, severe (HCC)  History of Present Illness: This is the first psychiatric admission for Krista Schroeder, a 25 year old AA female with previous hx of depression & anxiety. 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 She is admitted to the [facility] from the [facility] ED with complain of suicidal ideations, suicide attempt by self-inflicted superficial laceration to inner left wrist. After medically evaluated/cleared at the ED, she was sent to the [facility] for psychiatric evaluation & treatment. During this evaluation, Krista Schroeder reports,  "My friend took me to the ED yesterday. I had made a suicide attempt a couple of hours prior. I slit my right wrist with a knife. I have been having suicidal ideations since last September, 2021. I have been grieving a lot & feeling overwhelmed dealing with my family. I know that my family cares about me, but in my head, I'm thinking they do not understand what I'm going through or my depression. I have feeling depressed since 2017. It started with bad anxiety, but no one in my family understands. Prior to starting college, my cousin died of breast cancer. After that, my friend died with her 3 other family memebrs. They were shot to death by her brother. I have been grieving hard. Then, my paternal grandmother died as well. I was treated for depression sometime last year. I was given. SRRI. I took it for a month, it made me feel weird, I stopped taking it. After I stopped the medicine, all the emotions came back & worsened. I recently heard that someone I knew from high school committed  suicide. I do not use drugs or alcohol".  Patient at this time rates her depression #2 & anxiety #1.  Associated Signs/Symptoms:  Depression Symptoms:  depressed mood, insomnia, suicidal thoughts with specific plan, anxiety,  Duration of Depression Symptoms: Greater than two weeks  (Hypo) Manic Symptoms:  Impulsivity,  Anxiety Symptoms:  Excessive Worry,  Psychotic Symptoms:  Denies  PTSD Symptoms: NA  Total Time spent with patient: 1 hour  Past Psychiatric History: Depression & anxiety since 2017.  Is the patient at risk to self? No. (Not currently)  Has the patient been a risk to self in the past 6 months? Yes.     Has the patient been a risk to self within the distant past? Yes.     Is the patient a risk to others? No.   Has the patient been a risk to others in the past 6 months? No.   Has the patient been a risk to others within the distant past? No.   Prior Inpatient Therapy: Denies Prior Outpatient Therapy: Yes  Alcohol Screening: 1. How often do you have a drink containing alcohol?: Monthly or less 2. How many drinks containing alcohol do you have on a typical day when you are drinking?: 1 or 2 3. How often do you have six or more drinks on one occasion?: Never AUDIT-C Score: 1 4. How often during the last year have you found that you were not able to stop drinking once you had started?: Never 5. How often during the last year have you failed to do what was normally expected  from you because of drinking?: Never 6. How often during the last year have you needed a first drink in the morning to get yourself going after a heavy drinking session?: Never 7. How often during the last year have you had a feeling of guilt of remorse after drinking?: Never 8. How often during the last year have you been unable to remember what happened the night before because you had been drinking?: Never 9. Have you or someone else been injured as a result of your drinking?: No 10.  Has a relative or friend or a doctor or another health worker been concerned about your drinking or suggested you cut down?: No Alcohol Use Disorder Identification Test Final Score (AUDIT): 1 Alcohol Brief Interventions/Follow-up: Alcohol Education,AUDIT Score <7 follow-up not indicated  Substance Abuse History in the last 12 months:  No.  Consequences of Substance Abuse: NA  Previous Psychotropic Medications: "Yes, I took an SSRI x 1 month, then stopped".  Psychological Evaluations: No   Past Medical History:  Past Medical History:  Diagnosis Date  . Depression     Past Surgical History:  Procedure Laterality Date  . NO PAST SURGERIES     Family History:  Family History  Problem Relation Age of Onset  . Healthy Mother   . Diabetes Father    Family Psychiatric  History: Depression: Father  Tobacco Screening: Have you used any form of tobacco in the last 30 days? 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 Social History   Substance and Sexual Activity  Alcohol Use Yes   Comment: occasionally, once every few months, 1/2 cup in amount, last drink was Saturday     Social History   Substance and Sexual Activity  Drug Use Never    Additional Social History:  Allergies:  No Known Allergies  Lab Results:  Results for orders placed or performed during the hospital encounter of 10/05/20 (from the past 48 hour(s))  Comprehensive metabolic panel     Status: Abnormal   Collection Time: 10/05/20  1:10 PM  Result Value Ref Range   Sodium 140 135 - 145 mmol/L   Potassium 4.0 3.5 - 5.1 mmol/L   Chloride 110 98 - 111 mmol/L   CO2 22 22 - 32 mmol/L   Glucose, Bld 101 (H) 70 - 99 mg/dL    Comment: Glucose reference range applies only to samples taken after fasting for at least 8 hours.   BUN 7 6 - 20 mg/dL   Creatinine, Ser 8.290.74 0.44 - 1.00 mg/dL   Calcium 9.1 8.9 - 56.210.3 mg/dL   Total Protein 7.7  6.5 - 8.1 g/dL   Albumin 3.9 3.5 - 5.0 g/dL   AST 29 15 - 41 U/L   ALT 19 0 - 44 U/L   Alkaline Phosphatase 73 38 - 126 U/L   Total Bilirubin 0.8 0.3 - 1.2 mg/dL   GFR, Estimated >13>60 >08>60 mL/min    Comment: (NOTE) Calculated using the CKD-EPI Creatinine Equation (2021)    Anion gap 8 5 - 15    Comment: Performed at [facility], [address] [place], [place] [address]  Ethanol     Status: None   Collection Time: 10/05/20  1:10 PM  Result Value Ref Range   Alcohol, Ethyl (B) <10 <10 mg/dL    Comment: (NOTE) Lowest detectable limit for serum alcohol is 10 mg/dL.  For medical purposes only. Performed at [facility], [address], [place], [place] [address]   Salicylate level     Status: Abnormal   Collection Time: 10/05/20  1:10 PM  Result Value Ref Range   Salicylate Lvl <7.0 (L) 7.0 - 30.0 mg/dL    Comment: Performed at [facility], [address] [place], [place] [address]  Acetaminophen level     Status: Abnormal   Collection Time: 10/05/20  1:10 PM  Result Value Ref Range   Acetaminophen (Tylenol), Serum <10 (L) 10 - 30 ug/mL    Comment: (NOTE) Therapeutic concentrations vary significantly. A range of 10-30 ug/mL  may be an effective concentration for many patients. However, some  are best treated at concentrations outside of this range. Acetaminophen concentrations >150 ug/mL at 4 hours after ingestion  and >50 ug/mL at 12 hours after ingestion are often associated with  toxic reactions.  Performed at [facility], [address] [place], [place] [address]   cbc     Status: None   Collection Time: 10/05/20  1:10 PM  Result Value Ref Range   WBC 4.0 4.0 - 10.5 K/uL   RBC 4.41 3.87 - 5.11 MIL/uL   Hemoglobin 12.2 12.0 - 15.0 g/dL   HCT 81.2 75.1 - 70.0 %   MCV 85.3 80.0 - 100.0 fL   MCH 27.7 26.0 - 34.0 pg   MCHC 32.4 30.0 - 36.0 g/dL   RDW 17.4 94.4 - 96.7 %   Platelets 310 150  - 400 K/uL   nRBC 0.0 0.0 - 0.2 %    Comment: Performed at [facility], [address], [place], [place] [address]  Rapid urine drug screen (hospital performed)     Status: None   Collection Time: 10/05/20  1:10 PM  Result Value Ref Range   Opiates NONE DETECTED NONE DETECTED   Cocaine NONE DETECTED NONE DETECTED   Benzodiazepines NONE DETECTED NONE DETECTED   Amphetamines NONE DETECTED NONE DETECTED   Tetrahydrocannabinol NONE DETECTED NONE DETECTED   Barbiturates NONE DETECTED NONE DETECTED    Comment: (NOTE) DRUG SCREEN FOR MEDICAL PURPOSES ONLY.  IF CONFIRMATION IS NEEDED FOR ANY PURPOSE, NOTIFY LAB WITHIN 5 DAYS.  LOWEST DETECTABLE LIMITS FOR URINE DRUG SCREEN Drug Class                     Cutoff (ng/mL) Amphetamine and metabolites    1000 Barbiturate and metabolites    200 Benzodiazepine                 200 Tricyclics and metabolites     300 Opiates and metabolites        300 Cocaine and metabolites        300 THC                            50 Performed at [facility], [address], [place], [place] [address]   Pregnancy, urine     Status: None   Collection Time: 10/05/20  1:10 PM  Result Value Ref Range   Preg Test, Ur NEGATIVE NEGATIVE    Comment:        THE SENSITIVITY OF THIS METHODOLOGY IS >20 mIU/mL. Performed at [facility], [address], [place], [place] [address]   Resp Panel by RT-PCR (Flu A&B, Covid) Nasopharyngeal Swab     Status: None   Collection Time: 10/05/20  1:57  PM   Specimen: Nasopharyngeal Swab; Nasopharyngeal(NP) swabs in vial transport medium  Result Value Ref Range   SARS Coronavirus 2 by RT PCR NEGATIVE NEGATIVE    Comment: (NOTE) SARS-CoV-2 target nucleic acids are NOT DETECTED.  The SARS-CoV-2 RNA is generally detectable in upper respiratory specimens during the acute phase of infection. The lowest concentration of SARS-CoV-2 viral copies this assay can detect is 138  copies/mL. A negative result does not preclude SARS-Cov-2 infection and should not be used as the sole basis for treatment or other patient management decisions. A negative result may occur with  improper specimen collection/handling, submission of specimen other than nasopharyngeal swab, presence of viral mutation(s) within the areas targeted by this assay, and inadequate number of viral copies(<138 copies/mL). A negative result must be combined with clinical observations, patient history, and epidemiological information. The expected result is Negative.  Fact Sheet for Patients:  BloggerCourse.com  Fact Sheet for Healthcare Providers:  SeriousBroker.it  This test is no t yet approved or cleared by the [place] FDA and  has been authorized for detection and/or diagnosis of SARS-CoV-2 by FDA under an Emergency Use Authorization (EUA). This EUA will remain  in effect (meaning this test can be used) for the duration of the COVID-19 declaration under Section 564(b)(1) of the Act, 21 U.S.C.section 360bbb-3(b)(1), unless the authorization is terminated  or revoked sooner.       Influenza A by PCR NEGATIVE NEGATIVE   Influenza B by PCR NEGATIVE NEGATIVE    Comment: (NOTE) The Xpert Xpress SARS-CoV-2/FLU/RSV plus assay is intended as an aid in the diagnosis of influenza from Nasopharyngeal swab specimens and should not be used as a sole basis for treatment. Nasal washings and aspirates are unacceptable for Xpert Xpress SARS-CoV-2/FLU/RSV testing.  Fact Sheet for Patients: BloggerCourse.com  Fact Sheet for Healthcare Providers: SeriousBroker.it  This test is not yet approved or cleared by the [place] FDA and has been authorized for detection and/or diagnosis of SARS-CoV-2 by FDA under an Emergency Use Authorization (EUA). This EUA will remain in effect (meaning this test  can be used) for the duration of the COVID-19 declaration under Section 564(b)(1) of the Act, 21 U.S.C. section 360bbb-3(b)(1), unless the authorization is terminated or revoked.  Performed at [facility], [address], [place], [place] [address]    Blood Alcohol level:  Lab Results  Component Value Date   ETH <10 10/05/2020   Metabolic Disorder Labs:  No results found for: HGBA1C, MPG No results found for: PROLACTIN No results found for: CHOL, TRIG, HDL, CHOLHDL, VLDL, LDLCALC  Current Medications: Current Facility-Administered Medications  Medication Dose Route Frequency Provider Last Rate Last Admin  . hydrOXYzine (ATARAX/VISTARIL) tablet 25 mg  25 mg Oral TID PRN Nira Conn A, NP   25 mg at 10/06/20 0048  . traZODone (DESYREL) tablet 50 mg  50 mg Oral QHS PRN Nira Conn A, NP   50 mg at 10/06/20 0048   PTA Medications: No medications prior to admission.   Musculoskeletal: Strength & Muscle Tone: within normal limits Gait & Station: normal Patient leans: N/A  Psychiatric Specialty Exam:  Presentation  General Appearance: Casual; Fairly Groomed; Appropriate for Environment  Eye Contact:Fair  Speech:Clear and Coherent; Normal Rate  Speech Volume:Normal  Handedness:Left   Mood and Affect  Mood:Depressed  Affect:Flat  Thought Process  Thought Processes:Coherent  Duration of Psychotic Symptoms: No data recorded Past Diagnosis of Schizophrenia or Psychoactive disorder: No  Descriptions of Associations:Intact  Orientation:Full (  Time, Place and Person)  Thought Content:Logical; WDL  Hallucinations:Hallucinations: None  Ideas of Reference:None  Suicidal Thoughts:Suicidal Thoughts: No  Homicidal Thoughts:Homicidal Thoughts: No   Sensorium  Memory:Immediate Good; Recent Good; Remote Good  Judgment:Fair  Insight:Fair  Executive Functions  Concentration:Good  Attention Span:Good  Recall:Good  Fund of  Knowledge:Fair  Language:Good   Psychomotor Activity  Psychomotor Activity:Psychomotor Activity: Normal  Assets  Assets:Communication Skills; Desire for Improvement; Physical Health; Social Support  Sleep  Sleep:Sleep: Fair  Physical Exam: Physical Exam Vitals and nursing note reviewed.  HENT:     Head: Normocephalic.     Nose: Nose normal.     Mouth/Throat:     Pharynx: Oropharynx is clear.  Eyes:     Pupils: Pupils are equal, round, and reactive to light.  Cardiovascular:     Rate and Rhythm: Normal rate.     Pulses: Normal pulses.  Pulmonary:     Effort: Pulmonary effort is normal.  Genitourinary:    Comments: Deferred Musculoskeletal:        General: Normal range of motion.     Cervical back: Normal range of motion.  Skin:    General: Skin is warm and dry.  Neurological:     General: No focal deficit present.     Mental Status: She is alert and oriented to person, place, and time.    Review of Systems  Constitutional: Negative for chills and fever.  HENT: Negative for congestion and sore throat.   Eyes: Negative for blurred vision.  Respiratory: Negative for cough, shortness of breath and wheezing.   Cardiovascular: Negative for chest pain and palpitations.  Gastrointestinal: Negative for abdominal pain, diarrhea, heartburn, nausea and vomiting.  Genitourinary: Negative for dysuria.  Musculoskeletal: Negative for joint pain and myalgias.  Neurological: Negative for dizziness, tingling, tremors, sensory change, speech change, focal weakness, seizures, loss of consciousness, weakness and headaches.  Endo/Heme/Allergies: Negative for environmental allergies. Does not bruise/bleed easily.       Allergies: NKDA  Psychiatric/Behavioral: Positive for depression and substance abuse. Negative for hallucinations and memory loss. The patient is nervous/anxious and has insomnia.    Blood pressure 108/80, pulse 96, temperature 98 F (36.7 C), resp. rate 18, height 5\' 3"   (1.6 m), weight 97.1 kg, last menstrual period 09/27/2020, SpO2 100 %. Body mass index is 37.91 kg/m.  Treatment Plan Summary: Daily contact with patient to assess and evaluate symptoms and progress in treatment and Medication management.  Treatment Plan/Recommendations:  1. Admit for crisis management and stabilization, estimated length of stay 3-5 days.    2. Medication management to reduce current symptoms to base line and improve the patient's overall level of functioning: See MAR, Md's SRA & treatment plan.   Observation Level/Precautions:  15 minute checks  Laboratory:  Per ED, current lad results reviewed. Will obtain TSH, Hgba1c, lipid panel & U/A  Psychotherapy: Group sessions  Medications: See MAR   Consultations: AS needed    Discharge Concerns: Safety, mood stabilization  Estimated LOS: 3-5 days  Other: Admit to the [address]-hall.   Physician Treatment Plan for Primary Diagnosis: MDD (major depressive disorder), recurrent episode, severe (HCC) Long Term Goal(s): Improvement in symptoms so as ready for discharge  Short Term Goals: Ability to identify changes in lifestyle to reduce recurrence of condition will improve, Ability to verbalize feelings will improve, Ability to disclose and discuss suicidal ideas and Ability to demonstrate self-control will improve  Physician Treatment Plan for Secondary Diagnosis: Principal Problem:   MDD (major depressive  disorder), recurrent episode, severe (HCC)  Long Term Goal(s): Improvement in symptoms so as ready for discharge  Short Term Goals: Ability to identify and develop effective coping behaviors will improve, Ability to maintain clinical measurements within normal limits will improve and Compliance with prescribed medications will improve  I certify that inpatient services furnished can reasonably be expected to improve the patient's condition.    Armandina Stammer, NP, PMHNP, FNP-BC. 3/25/20222:35 PM

## 2020-10-06 NOTE — BHH Group Notes (Signed)
Type of Therapy and Topic:  Group Therapy: What's So Funny About Mental Illness?  Participation Level:  Active   Description of Group:   In this group, patients learned how to recognize how they personally define their mental illness. This group also addressed stigma associated with mental illness.  Patients were asked to give examples of experiences surrounding living with a mental illness and how it makes them feel. Patients were asked to self-reflect on how they have chosen to address their mental health thus far and were invited to share those lessons or to discuss how stigma has affected their mental health care. Patients actively explore how their mental illness has impacted decisions and actions as well as how future decisions can be impacted.  Therapeutic Goals: 1. Patients will identify what can be considered mental illness 2. Patients will identify lessons learned from past experiences and how they can be applied to future struggles. 3. Patients will establish rapport with peers in a therapeutic setting.  Summary of Patient Progress:  The patient shared their understanding and/or definition of mental illness. The patient shared that they are happy to be alive and that they have recently lost 3 family members.  The patient also discussed how this has impacted their mental health. The patient shared if they reframed their idea of mental illness this would change the way they see themselves and the stress they feel from being overwhelmed.  Therapeutic Modalities:   Cognitive Behavioral Therapy

## 2020-10-06 NOTE — Plan of Care (Signed)
  Problem: Education: Goal: Verbalization of understanding the information provided will improve Outcome: Progressing   Problem: Activity: Goal: Interest or engagement in activities will improve Outcome: Progressing   Problem: Coping: Goal: Ability to verbalize frustrations and anger appropriately will improve Outcome: Progressing   Problem: Health Behavior/Discharge Planning: Goal: Identification of resources available to assist in meeting health care needs will improve Outcome: Progressing
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
## 2020-10-06 NOTE — Progress Notes (Signed)
Recreation Therapy Notes  Date:  3.25.22 Time: 0930 Location: [address] Dayroom  Group Topic: Stress Management  Goal Area(s) Addresses:  Patient will identify positive stress management techniques. Patient will identify benefits of using stress management post d/c.  Behavioral Response: Engaged  Intervention: Stress Management  Activity: Meditation.  LRT played a meditation that focused on calming anxiety.  Patients were lead through a few breathing techniques to teach them how to calm themselves when feeling anxious.  Patients were also encouraged to let go of any thoughts or feelings that maybe holding them back.    Education:  Stress Management, Discharge Planning.   Education Outcome: Acknowledges Education  Clinical Observations/Feedback: Pt attended and participated.  Pt sat quietly and listened as the meditation played.    Caroll Rancher, LRT/CTRS     Lillia Abed, Marjette A 10/06/2020 11:22 AM

## 2020-10-07 DIAGNOSIS — F332 Major depressive disorder, recurrent severe without psychotic features: Secondary | ICD-10-CM | POA: Diagnosis not present

## 2020-10-07 LAB — LIPID PANEL
Cholesterol: 165 mg/dL (ref 0–200)
HDL: 54 mg/dL (ref >40)
LDL Cholesterol: 70 mg/dL (ref 0–99)
Total CHOL/HDL Ratio: 3.1 RATIO
Triglycerides: 206 mg/dL — ABNORMAL HIGH (ref <150)
VLDL: 41 mg/dL — ABNORMAL HIGH (ref 0–40)

## 2020-10-07 LAB — TSH: TSH: 0.389 u[IU]/mL (ref 0.350–4.500)

## 2020-10-07 NOTE — Progress Notes (Signed)
Patient stated she has been having symptoms from her medication since 10:00 a.m. this morning during group.  Has experienced light sensitivity, dizzy and slight headache.  Vistaril given patient per her request.  No signs/symptoms of pain/distress noted on patient's face/body movements.  Respirations even and unlabored.

## 2020-10-07 NOTE — Plan of Care (Signed)
Nurse discussed coping skills with patient.  

## 2020-10-07 NOTE — BHH Group Notes (Signed)
LCSW Group Therapy Note  10/07/2020   10:00-11:00am   Type of Therapy and Topic:  Group Therapy: Anger Cues and Responses  Participation Level:  Active   Description of Group:   In this group, patients learned how to recognize the physical, cognitive, emotional, and behavioral responses they have to anger-provoking situations.  They identified a recent time they became angry and how they reacted.  They analyzed how their reaction was possibly beneficial and how it was possibly unhelpful.  The group discussed a variety of healthier coping skills that could help with such a situation in the future.  Focus was placed on how helpful it is to recognize the underlying emotions to our anger, because working on those can lead to a more permanent solution as.  Group discussion also focused on the way in which assuming you know what other people's intentions are can lead [place] to react inappropriately, but also how difficult it is to assume the best of others because of past hurts.  Therapeutic Goals: 1. Patients will remember their last incident of anger and how they felt emotionally and physically, what their thoughts were at the time, and how they behaved. 2. Patients will identify how their behavior at that time worked for them, as well as how it worked against them. 3. Patients will explore possible new behaviors to use in future anger situations. 4. Patients will learn that anger itself is normal and cannot be eliminated, and that healthier reactions can assist with resolving conflict rather than worsening situations.  Summary of Patient Progress:  The patient shared that her most recent time of anger was with her cousins who were drinking and talking about another cousin who died.  She kept telling them to stop, but they did not.  The patient said she can become passive-aggressive, so she told them she was fine but her face said something different.  She said the end result was that she blocked almost all  her relatives on phone and social media.  A healthy thing she will sometimes do with her anger is to go running.  She later talked about also blocking her mother because her mother was talking about her weight, and that is a subject she has tried to tell her mother several times is off-limits because she already feels bad enough about herself.  Therapeutic Modalities:   Cognitive Behavioral Therapy  Lynnell Chad

## 2020-10-07 NOTE — BHH Group Notes (Signed)
Psychoeducational Group Note  Date: 10-07-2020 Time: 0900-1000    Goal Setting   Purpose of Group: This group helps to provide patients with the steps of setting a goal that is specific, measurable, attainable, realistic and time specific. A discussion on how we keep ourselves stuck with negative self talk.    Participation Level:  Active  Participation Quality:  Appropriate  Affect:  Appropriate  Cognitive:  Appropriate  Insight:  Improving  Engagement in Group:  Engaged  Additional Comments:  Rates her energy at a 5/10. Working on her homework of 30 positives about herself.  Dione Housekeeper

## 2020-10-07 NOTE — Progress Notes (Signed)
D:  Patient denied SI and HI, contracts for safety.  Denied A/V hallucinations.  Denied pain. A:  Medications administered per MD orders.  Emotional support and encouragement given patient. R:  Safety maintained with 15 minute checks.  

## 2020-10-07 NOTE — Progress Notes (Signed)
[facility] MD Progress Note  10/07/2020 3:17 PM Krista Schroeder  MRN:  [id number]  Subjective: Krista Schroeder reports, "I'm feeling pretty good today, feeling like I want get better".  Objective: 25 year old female with a previous history of depression and anxiety and no history of inpatient psychiatric admissions.  Who presented to [facility] ED with complaints of suicidal ideation and a suicide attempt via self-inflicted superficial laceration to her right wrist. Daily notes: Barry is seen, chart reviewed. The chart finding discussed with the treatment team. She presents alert, oriented & aware of situation. She is visible on the unit, attending group sessions. She presents today with an improved affect & eye contact. She is in her room practicing her yoga exercise. She says she is feeling pretty good today. She says she feels she wants to get better. She denies any SIHI, AVH, delusional thoughts or paranoia. She does not appear to be responding to any internal stimuli. [name] is taking & tolerating her treatment regimen. She denies any side effects. She is in agreement to continue current plan of care as already in progress.  Principal Problem: MDD (major depressive disorder), recurrent episode, severe (HCC)  Diagnosis: Principal Problem:   MDD (major depressive disorder), recurrent episode, severe (HCC)  Total Time spent with patient: 25 minutes  Past Psychiatric History: See H&P  Past Medical History:  Past Medical History:  Diagnosis Date  . Depression     Past Surgical History:  Procedure Laterality Date  . NO PAST SURGERIES     Family History:  Family History  Problem Relation Age of Onset  . Healthy Mother   . Diabetes Father    Family Psychiatric  History: See H&P  Social History:  Social History   Substance and Sexual Activity  Alcohol Use Yes   Comment: occasionally, once every few months, 1/2 cup in amount, last drink was Saturday     Social History   Substance  and Sexual Activity  Drug Use Never    Social History   Socioeconomic History  . Marital status: Single    Spouse name: Not on file  . Number of children: Not on file  . Years of education: Not on file  . Highest education level: Not on file  Occupational History  . Not on file  Tobacco Use  . Smoking status: Never Smoker  . Smokeless tobacco: Never Used  Vaping Use  . Vaping Use: Never used  Substance and Sexual Activity  . Alcohol use: Yes    Comment: occasionally, once every few months, 1/2 cup in amount, last drink was Saturday  . Drug use: Never  . Sexual activity: Not Currently    Birth control/protection: None  Other Topics Concern  . Not on file  Social History Narrative  . Not on file   Social Determinants of Health   Financial Resource Strain: Not on file  Food Insecurity: Not on file  Transportation Needs: Not on file  Physical Activity: Not on file  Stress: Not on file  Social Connections: Not on file   Additional Social History:   Sleep: Fair  Appetite:  Good  Current Medications: Current Facility-Administered Medications  Medication Dose Route Frequency Provider Last Rate Last Admin  . escitalopram (LEXAPRO) tablet 10 mg  10 mg Oral Daily Armandina Stammer I, NP   10 mg at 10/07/20 0741  . hydrOXYzine (ATARAX/VISTARIL) tablet 25 mg  25 mg Oral TID PRN Jackelyn Poling, NP   25 mg at 10/06/20 2155  .  traZODone (DESYREL) tablet 50 mg  50 mg Oral QHS PRN Nira Conn A, NP   50 mg at 10/06/20 2155    Lab Results: No results found for this or any previous visit (from the past 48 hour(s)).  Blood Alcohol level:  Lab Results  Component Value Date   ETH <10 10/05/2020    Metabolic Disorder Labs: No results found for: HGBA1C, MPG No results found for: PROLACTIN No results found for: CHOL, TRIG, HDL, CHOLHDL, VLDL, LDLCALC  Physical Findings: AIMS: Facial and Oral Movements Muscles of Facial Expression: None, normal Lips and Perioral Area: None,  normal Jaw: None, normal Tongue: None, normal,Extremity Movements Upper (arms, wrists, hands, fingers): None, normal Lower (legs, knees, ankles, toes): None, normal, Trunk Movements Neck, shoulders, hips: None, normal, Overall Severity Severity of abnormal movements (highest score from questions above): None, normal Incapacitation due to abnormal movements: None, normal Patient's awareness of abnormal movements (rate only patient's report): No Awareness, Dental Status Current problems with teeth and/or dentures?: No Does patient usually wear dentures?: No  CIWA:  CIWA-Ar Total: 1 COWS:  COWS Total Score: 2  Musculoskeletal: Strength & Muscle Tone: within normal limits Gait & Station: normal Patient leans: N/A  Psychiatric Specialty Exam:  Presentation  General Appearance: Appropriate for Environment; Fairly Groomed  Eye Contact:Good  Speech:Clear and Coherent; Normal Rate  Speech Volume:Normal  Handedness:Left  Mood and Affect  Mood:Depressed  Affect:Congruent; Constricted  Thought Process  Thought Processes:Coherent; Goal Directed  Descriptions of Associations:Intact  Orientation:Full (Time, Place and Person)  Thought Content:Logical  History of Schizophrenia/Schizoaffective disorder:No  Duration of Psychotic Symptoms:No data recorded Hallucinations:Hallucinations: None  Ideas of Reference:None  Suicidal Thoughts:Suicidal Thoughts: No  Homicidal Thoughts:Homicidal Thoughts: No   Sensorium  Memory:Immediate Good; Recent Good; Remote Good  Judgment:Fair  Insight:Fair  Executive Functions  Concentration:Good  Attention Span:Good  Recall:Good  Fund of Knowledge:Good  Language:Good  Psychomotor Activity  Psychomotor Activity:Psychomotor Activity: Normal  Assets  Assets:Communication Skills; Desire for Improvement; Physical Health; Social Support  Sleep  Sleep:Sleep: Fair  Physical Exam: Physical Exam Vitals and nursing note reviewed.   HENT:     Head: Normocephalic.     Nose: Nose normal.     Mouth/Throat:     Pharynx: Oropharynx is clear.  Eyes:     Pupils: Pupils are equal, round, and reactive to light.  Cardiovascular:     Rate and Rhythm: Normal rate.     Pulses: Normal pulses.  Pulmonary:     Effort: Pulmonary effort is normal.  Genitourinary:    Comments: Deferred Musculoskeletal:        General: Normal range of motion.     Cervical back: Normal range of motion.  Skin:    General: Skin is warm and dry.  Neurological:     General: No focal deficit present.     Mental Status: She is alert and oriented to person, place, and time. Mental status is at baseline.    Review of Systems  Constitutional: Negative.   HENT: Negative.   Eyes: Negative.   Respiratory: Negative.   Cardiovascular: Negative.   Gastrointestinal: Negative for abdominal pain, diarrhea, heartburn, nausea and vomiting.  Genitourinary: Negative for dysuria.  Musculoskeletal: Negative for joint pain and myalgias.  Skin: Negative.   Neurological: Negative.   Endo/Heme/Allergies:       Allergies: NKDA  Psychiatric/Behavioral: Positive for depression ("Improving"). Negative for hallucinations, memory loss, substance abuse and suicidal ideas. The patient is nervous/anxious and has insomnia.    Blood pressure  105/65, pulse (!) 114, temperature 98.1 F (36.7 C), temperature source Oral, resp. rate 16, height 5\' 3"  (1.6 m), weight 97.1 kg, last menstrual period 09/27/2020, SpO2 100 %. Body mass index is 37.91 kg/m.  Treatment Plan Summary: Daily contact with patient to assess and evaluate symptoms and progress in treatment and Medication management.  Continue inpatient hospitalization. Will continue today 10/07/2020 plan as below except where it is noted.  Depression. Continue Lexapro 10 mg po daily.  Anxiety. Continue Vistaril 25 mg po tid prn.  Insomnia. Continue Trazodone 50 mg po Q hs prn.   Continue other prn medications for  pain, indigestion & constipation.  Encourage group participation. Discharge disposition plan in progress.   10/09/2020, NP, PMHNP, FNP-BC 10/07/2020, 3:17 PM

## 2020-10-07 NOTE — Progress Notes (Signed)
Pt stated she was doing better. Pt visible on the unit this evening. Pt stated she continues to have hope for the future Pt got PRN Vistaril and Trazodone per [facility]   10/07/20 2000  Psych Admission Type (Psych Patients Only)  Admission Status Voluntary  Psychosocial Assessment  Patient Complaints Anxiety;Depression  Eye Contact Fair  Facial Expression Anxious;Sullen;Sad;Worried  Affect Anxious;Depressed;Sad;Sullen  Furniture conservator/restorer  Appearance/Hygiene Unremarkable  Behavior Characteristics Cooperative  Mood Anxious  Thought Process  Coherency WDL  Content Blaming others  Delusions None reported or observed  Perception WDL  Hallucination None reported or observed  Judgment Poor  Confusion None  Danger to Self  Current suicidal ideation? Denies  Danger to Others  Danger to Others None reported or observed

## 2020-10-08 DIAGNOSIS — F339 Major depressive disorder, recurrent, unspecified: Secondary | ICD-10-CM | POA: Diagnosis present

## 2020-10-08 DIAGNOSIS — F332 Major depressive disorder, recurrent severe without psychotic features: Secondary | ICD-10-CM | POA: Diagnosis not present

## 2020-10-08 LAB — URINALYSIS, ROUTINE W REFLEX MICROSCOPIC
Bilirubin Urine: NEGATIVE
Glucose, UA: NEGATIVE mg/dL
Hgb urine dipstick: NEGATIVE
Ketones, ur: NEGATIVE mg/dL
Leukocytes,Ua: NEGATIVE
Nitrite: NEGATIVE
Protein, ur: NEGATIVE mg/dL
Specific Gravity, Urine: 1.027 (ref 1.005–1.030)
pH: 6 (ref 5.0–8.0)

## 2020-10-08 LAB — HEMOGLOBIN A1C
Hgb A1c MFr Bld: 5.6 % (ref 4.8–5.6)
Mean Plasma Glucose: 114.02 mg/dL

## 2020-10-08 MED ORDER — ACETAMINOPHEN 325 MG PO TABS: 650.0000 mg | ORAL_TABLET | Freq: Four times a day (QID) | ORAL | Status: DC | PRN

## 2020-10-08 MED ORDER — ALUM & MAG HYDROXIDE-SIMETH 200-200-20 MG/5ML PO SUSP: 30.0000 mL | ORAL | Status: DC | PRN

## 2020-10-08 MED ORDER — MAGNESIUM HYDROXIDE 400 MG/5ML PO SUSP: 30.0000 mL | Freq: Every day | ORAL | Status: DC | PRN

## 2020-10-08 NOTE — Progress Notes (Signed)
   10/08/20 2234  Psych Admission Type (Psych Patients Only)  Admission Status Voluntary  Psychosocial Assessment  Patient Complaints None  Eye Contact Fair  Facial Expression Anxious  Affect Appropriate to circumstance  Speech Logical/coherent  Interaction Assertive  Motor Activity Other (Comment) (WDL)  Appearance/Hygiene Unremarkable  Behavior Characteristics Appropriate to situation  Mood Anxious;Pleasant  Thought Process  Coherency WDL  Content WDL  Delusions None reported or observed  Perception WDL  Hallucination None reported or observed  Judgment Poor  Confusion None  Danger to Self  Current suicidal ideation? Denies  Danger to Others  Danger to Others None reported or observed

## 2020-10-08 NOTE — Progress Notes (Signed)
[facility] MD Progress Note  10/08/2020 2:56 PM Krista Schroeder  MRN:  [id number]  Subjective: Krista Schroeder reports, "I'm doing pretty good, just tired. I now have a lot of tools to use after discharge. I have learned quite much from the group sessions. I feel a lot better. Now, when am I going to be discharged".  Objective: 25 year old female with a previous history of depression and anxiety and no history of inpatient psychiatric admissions.  Who presented to [facility] ED with complaints of suicidal ideation and a suicide attempt via self-inflicted superficial laceration to her right wrist. Daily notes: [name] is seen, chart reviewed. The chart finding discussed with the treatment team. She presents alert, oriented & aware of situation. She is visible on the unit, attending group sessions. She presents today with an improved affect & eye contact. She was in her room practicing her yoga exercise. She says she is feeling pretty good today, learned a lot of coping skills to use after discharge in times of distress. She denies any SIHI, AVH, delusional thoughts or paranoia. She does not appear to be responding to any internal stimuli. Keyly is taking & tolerating her treatment regimen. She denies any side effects. She is in agreement to continue current plan of care as already in progress. Can be discharged in the morning. Patient says she feels she is stable enough for discharge.  Principal Problem: MDD (major depressive disorder), recurrent episode, severe (HCC)  Diagnosis: Principal Problem:   MDD (major depressive disorder), recurrent episode, severe (HCC) Active Problems:   Major depressive disorder, recurrent episode (HCC)  Total Time spent with patient: 15 minutes  Past Psychiatric History: See H&P  Past Medical History:  Past Medical History:  Diagnosis Date  . Depression     Past Surgical History:  Procedure Laterality Date  . NO PAST SURGERIES     Family History:  Family History   Problem Relation Age of Onset  . Healthy Mother   . Diabetes Father    Family Psychiatric  History: See H&P  Social History:  Social History   Substance and Sexual Activity  Alcohol Use Yes   Comment: occasionally, once every few months, 1/2 cup in amount, last drink was Saturday     Social History   Substance and Sexual Activity  Drug Use Never    Social History   Socioeconomic History  . Marital status: Single    Spouse name: Not on file  . Number of children: Not on file  . Years of education: Not on file  . Highest education level: Not on file  Occupational History  . Not on file  Tobacco Use  . Smoking status: Never Smoker  . Smokeless tobacco: Never Used  Vaping Use  . Vaping Use: Never used  Substance and Sexual Activity  . Alcohol use: Yes    Comment: occasionally, once every few months, 1/2 cup in amount, last drink was Saturday  . Drug use: Never  . Sexual activity: Not Currently    Birth control/protection: None  Other Topics Concern  . Not on file  Social History Narrative  . Not on file   Social Determinants of Health   Financial Resource Strain: Not on file  Food Insecurity: Not on file  Transportation Needs: Not on file  Physical Activity: Not on file  Stress: Not on file  Social Connections: Not on file   Additional Social History:   Sleep: Fair  Appetite:  Good  Current Medications: Current Facility-Administered  Medications  Medication Dose Route Frequency Provider Last Rate Last Admin  . acetaminophen (TYLENOL) tablet 650 mg  650 mg Oral Q6H PRN Skilar Marcou I, NP      . alum & mag hydroxide-simeth (MAALOX/MYLANTA) 200-200-20 MG/5ML suspension 30 mL  30 mL Oral Q4H PRN Hilery Wintle I, NP      . escitalopram (LEXAPRO) tablet 10 mg  10 mg Oral Daily Deannah Rossi, Nicole KindredAgnes I, NP   10 mg at 10/08/20 0751  . hydrOXYzine (ATARAX/VISTARIL) tablet 25 mg  25 mg Oral TID PRN Jackelyn PolingBerry, Jason A, NP   25 mg at 10/07/20 2130  . magnesium hydroxide (MILK OF  MAGNESIA) suspension 30 mL  30 mL Oral Daily PRN Armandina StammerNwoko, Beola Vasallo I, NP      . traZODone (DESYREL) tablet 50 mg  50 mg Oral QHS PRN Jackelyn PolingBerry, Jason A, NP   50 mg at 10/07/20 2130   Lab Results:  Results for orders placed or performed during the hospital encounter of 10/06/20 (from the past 48 hour(s))  Hemoglobin A1c     Status: None   Collection Time: 10/07/20  6:24 PM  Result Value Ref Range   Hgb A1c MFr Bld 5.6 4.8 - 5.6 %    Comment: (NOTE) Pre diabetes:          5.7%-6.4%  Diabetes:              >6.4%  Glycemic control for   <7.0% adults with diabetes    Mean Plasma Glucose 114.02 mg/dL    Comment: Performed at [facility], [address] [place], [place] [address]  Lipid panel     Status: Abnormal   Collection Time: 10/07/20  6:24 PM  Result Value Ref Range   Cholesterol 165 0 - 200 mg/dL   Triglycerides 604206 (H) <150 mg/dL   HDL 54 >54>40 mg/dL   Total CHOL/HDL Ratio 3.1 RATIO   VLDL 41 (H) 0 - 40 mg/dL   LDL Cholesterol 70 0 - 99 mg/dL    Comment:        Total Cholesterol/HDL:CHD Risk Coronary Heart Disease Risk Table                     Men   Women  1/2 Average Risk   3.4   3.3  Average Risk       5.0   4.4  2 X Average Risk   9.6   7.1  3 X Average Risk  23.4   11.0        Use the calculated Patient Ratio above and the CHD Risk Table to determine the patient's CHD Risk.        ATP III CLASSIFICATION (LDL):  <100     mg/dL   Optimal  098-119100-129  mg/dL   Near or Above                    Optimal  130-159  mg/dL   Borderline  147-829160-189  mg/dL   High  >562>190     mg/dL   Very High Performed at [facility], [address] [place], [place] [address]   TSH     Status: None   Collection Time: 10/07/20  6:24 PM  Result Value Ref Range   TSH 0.389 0.350 - 4.500 uIU/mL    Comment: Performed by a 3rd Generation assay with a functional sensitivity of <=0.01 uIU/mL. Performed at [facility], [address] [place], [place] [address]     Blood Alcohol level:  Lab Results  Component Value Date   ETH <10 10/05/2020   Metabolic Disorder Labs: Lab Results  Component Value Date   HGBA1C 5.6 10/07/2020   MPG 114.02 10/07/2020   No results found for: PROLACTIN Lab Results  Component Value Date   CHOL 165 10/07/2020   TRIG 206 (H) 10/07/2020   HDL 54 10/07/2020   CHOLHDL 3.1 10/07/2020   VLDL 41 (H) 10/07/2020   LDLCALC 70 10/07/2020   Physical Findings: AIMS: Facial and Oral Movements Muscles of Facial Expression: None, normal Lips and Perioral Area: None, normal Jaw: None, normal Tongue: None, normal,Extremity Movements Upper (arms, wrists, hands, fingers): None, normal Lower (legs, knees, ankles, toes): None, normal, Trunk Movements Neck, shoulders, hips: None, normal, Overall Severity Severity of abnormal movements (highest score from questions above): None, normal Incapacitation due to abnormal movements: None, normal Patient's awareness of abnormal movements (rate only patient's report): No Awareness, Dental Status Current problems with teeth and/or dentures?: No Does patient usually wear dentures?: No  CIWA:  CIWA-Ar Total: 1 COWS:  COWS Total Score: 2  Musculoskeletal: Strength & Muscle Tone: within normal limits Gait & Station: normal Patient leans: N/A  Psychiatric Specialty Exam:  Presentation  General Appearance: Appropriate for Environment; Fairly Groomed; Well Groomed  Eye Contact:Good  Speech:Clear and Coherent; Normal Rate  Speech Volume:Normal  Handedness:Left  Mood and Affect  Mood:Euthymic ("I'm doing pretty good".)  Affect:Congruent; Constricted  Thought Process  Thought Processes:Coherent; Goal Directed  Descriptions of Associations:Intact  Orientation:Full (Time, Place and Person)  Thought Content:Logical  History of Schizophrenia/Schizoaffective disorder:No  Duration of Psychotic Symptoms: No. Hallucinations: No  Ideas of Reference:None  Suicidal  Thoughts: Denies  Homicidal Thoughts: Denies   Sensorium  Memory:Immediate Good; Recent Good; Remote Good  Judgment:Fair ("Improving")  Insight:Fair  Executive Functions  Concentration:Good  Attention Span:Good  Recall:Good  Fund of Knowledge:Good  Language:Good  Psychomotor Activity  Psychomotor Activity:Normal  Assets  Assets:Communication Skills; Desire for Improvement; Physical Health; Social Support  Sleep  Sleep: Good.  Physical Exam: Physical Exam Vitals and nursing note reviewed.  HENT:     Head: Normocephalic.     Nose: Nose normal.     Mouth/Throat:     Pharynx: Oropharynx is clear.  Eyes:     Pupils: Pupils are equal, round, and reactive to light.  Cardiovascular:     Rate and Rhythm: Normal rate.     Pulses: Normal pulses.  Pulmonary:     Effort: Pulmonary effort is normal.  Genitourinary:    Comments: Deferred Musculoskeletal:        General: Normal range of motion.     Cervical back: Normal range of motion.  Skin:    General: Skin is warm and dry.  Neurological:     General: No focal deficit present.     Mental Status: She is alert and oriented to person, place, and time. Mental status is at baseline.    Review of Systems  Constitutional: Negative.   HENT: Negative.   Eyes: Negative.   Respiratory: Negative.   Cardiovascular: Negative.   Gastrointestinal: Negative for abdominal pain, diarrhea, heartburn, nausea and vomiting.  Genitourinary: Negative for dysuria.  Musculoskeletal: Negative for joint pain and myalgias.  Skin: Negative.   Neurological: Negative.   Endo/Heme/Allergies:       Allergies: NKDA  Psychiatric/Behavioral: Positive for depression ("Improved" ). Negative for hallucinations, memory loss, substance abuse and suicidal ideas. The patient is not  nervous/anxious and does not have insomnia.    Blood pressure 110/67, pulse (!) 106, temperature 97.6 F (36.4 C), temperature source Oral, resp. rate 16, height 5\' 3"   (1.6 m), weight 97.1 kg, last menstrual period 09/27/2020, SpO2 99 %. Body mass index is 37.91 kg/m.  Treatment Plan Summary: Daily contact with patient to assess and evaluate symptoms and progress in treatment and Medication management.  Continue inpatient hospitalization. Will continue today 10/08/2020 plan as below except where it is noted.  Depression. Continue Lexapro 10 mg po daily.  Anxiety. Continue Vistaril 25 mg po tid prn.  Insomnia. Continue Trazodone 50 mg po Q hs prn.   Continue other prn medications for pain, indigestion & constipation.  Encourage group participation. Discharge disposition plan in progress.   10/10/2020, NP, PMHNP, FNP-BC 10/08/2020, 2:56 PMPatient ID: 10/10/2020, female   DOB: 1995-12-09, 25 y.o.   MRN: [id number]

## 2020-10-08 NOTE — [facility] Group Notes (Signed)
Psychoeducational Group Note    Date:10/08/2020 Time: 1300-1400    Life Skills:  A group where two lists are made. What people need and what are things that we do that are healthy. The lists are developed by the patients and it is explained that we often do the actions that are not healthy to get our list of needs met.   Purpose of Group: . The group focus' on teaching patients on how to identify their needs and how to develop the coping skills needed to get their needs met  Participation Level:  Active  Participation Quality:  Appropriate  Affect:  Appropriate  Cognitive:  Oriented  Insight:  Improving  Engagement in Group:  Engaged  Additional Comments:  Rates energy at an 8.  Parcipated fully  Paulino Rily

## 2020-10-08 NOTE — [facility] Group Notes (Signed)
Adult Psychoeducational Group Not Date:  10/08/2020 Time:  1505-6979 Group Topic/Focus: PROGRESSIVE RELAXATION. A group where deep breathing is taught and tensing and relaxation muscle groups is used. Imagery is used as well.  Pts are asked to imagine 3 pillars that hold them up when they are not able to hold themselves up.  Participation Level:  Active  Participation Quality:  Appropriate  Affect:  Appropriate  Cognitive:  Oriented  Insight: Improving  Engagement in Group:  Engaged  Modes of Intervention:  Activity, Discussion, Education, and Support  Additional Comments:  Raets her energy at a 8/10..  States her pillars are: her family, friends and writing. Dione Housekeeper

## 2020-10-08 NOTE — Plan of Care (Signed)
Nurse discussed stress relief with patient.

## 2020-10-08 NOTE — [facility] Group Notes (Signed)
[facility] LCSW Group Therapy Note  10/08/2020    Type of Therapy and Topic:  Group Therapy:  Adding Supports Including Yourself  Participation Level:  Active   Description of Group:   Patients in this group were introduced to the concept that additional supports including self-support are an essential part of recovery.  Patients listed their current healthy and unhealthy supports, and discussed the difference between the two.   Several songs were played and a group discussion ensued in which patients stated they could relate to the songs which inspired them to realize they have be willing to help themselves in order to succeed, because other people cannot achieve sobriety or stability for them.  Parents were encouraged toward self-advocacy and self-support as part of their recovery.  They discussed their reactions to these songs' messages, which were positive and hopeful.  Before group ended, they identified the supports they believe they need to add to their lives to achieve their goals at discharge.   Therapeutic Goals: 1)  explain the difference between healthy and unhealthy supports and discuss what specific supports are currently in patients' lives 2)  demonstrate the importance of being a key part of one's own support system 3)  discuss the need for appropriate boundaries with supports 4)  elicit ideas from patients about supports that need to be added in order to achieve goals   Summary of Patient Progress:   The patient listed current healthy supports as mother, friend, yoga, meditation, writing and current unhealthy supports as father and some cousins.  Prior to the end of group, patient expressed that supports she needs to add at discharge include exercise and structure.    Therapeutic Modalities:   Motivational Interviewing Activity  Lynnell Chad

## 2020-10-08 NOTE — Progress Notes (Signed)
   10/08/20 2231  COVID-19 Daily Checkoff  Have you had a fever (temp > 37.80C/100F)  in the past 24 hours?  No  If you have had runny nose, nasal congestion, sneezing in the past 24 hours, has it worsened? No  COVID-19 EXPOSURE  Have you traveled outside the state in the past 14 days? No  Have you been in contact with someone with a confirmed diagnosis of COVID-19 or PUI in the past 14 days without wearing appropriate PPE? No  Have you been living in the same home as a person with confirmed diagnosis of COVID-19 or a PUI (household contact)? No  Have you been diagnosed with COVID-19? No

## 2020-10-08 NOTE — Plan of Care (Signed)
Nurse discussed anxiety, depression and coping skills with patient.  

## 2020-10-08 NOTE — Progress Notes (Signed)
D:  Patient's self inventory sheet, patient sleeps good, sleep medication helpful.  Good appetite, normal energy level, good concentration.  Denied depression, hopeless and anxiety.  Denied withdrawals.  Denied SI.  Physical problems, headaches.  Goal is develop routine.  Plans to make a list.  No discharge plans. A:  Medications administered per MD orders.  Emotional support and encouragement given patient. R:  Denied SI and HI, contracts for safety.  Denied A/V hallucinations.  Safety maintained with 15 minute checks.

## 2020-10-08 NOTE — Progress Notes (Signed)
Adult Psychoeducational Group Note  Date:  10/08/2020 Time:  8:31 PM  Group Topic/Focus:  Wrap-Up Group:   The focus of this group is to help patients review their daily goal of treatment and discuss progress on daily workbooks.  Participation Level:  Active  Participation Quality:  Appropriate  Affect:  Appropriate  Cognitive:  Appropriate  Insight: Appropriate  Engagement in Group:  Engaged  Modes of Intervention:  Discussion  Additional Comments:  Patient said her day was a 8. The goal for today was to mak a list of daily things to do to help create structure. And he achieve her goal. Her coping skills meditation and alternate breathing technique  Krista Schroeder 10/08/2020, 8:31 PM
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
## 2020-10-09 DIAGNOSIS — F332 Major depressive disorder, recurrent severe without psychotic features: Secondary | ICD-10-CM | POA: Diagnosis not present

## 2020-10-09 MED ORDER — HYDROXYZINE HCL 25 MG PO TABS: 25.0000 mg | ORAL_TABLET | Freq: Three times a day (TID) | ORAL | 0 refills | Status: AC | PRN

## 2020-10-09 MED ORDER — ESCITALOPRAM OXALATE 10 MG PO TABS: 10.0000 mg | ORAL_TABLET | Freq: Every day | ORAL | 0 refills | Status: AC

## 2020-10-09 NOTE — [facility] Suicide Risk Assessment (Signed)
[facility] INPATIENT:  Family/Significant Other Suicide Prevention Education  Suicide Prevention Education:  Education Completed; Krista Schroeder [phone] (Friend) has been identified by the patient as the family member/significant other with whom the patient will be residing, and identified as the person(s) who will aid the patient in the event of a mental health crisis (suicidal ideations/suicide attempt).  With written consent from the patient, the family member/significant other has been provided the following suicide prevention education, prior to the and/or following the discharge of the patient.  The suicide prevention education provided includes the following:  Suicide risk factors  Suicide prevention and interventions  National Suicide Hotline telephone number  [facility] assessment telephone number  [facility] Emergency Assistance 911  [facility] and/or Residential Mobile Crisis Unit telephone number  Request made of family/significant other to:  Remove weapons (e.g., guns, rifles, knives), all items previously/currently identified as safety concern.    Remove drugs/medications (over-the-counter, prescriptions, illicit drugs), all items previously/currently identified as a safety concern.  The family member/significant other verbalizes understanding of the suicide prevention education information provided.  The family member/significant other agrees to remove the items of safety concern listed above.  CSW spoke with [name] who states that her friend isolated to her door room for approximately 3 days before coming to the hospital.  [name] states that her friend also stopped talking to her family during this time and stopped answering her phone.  Ms.  Krista Schroeder states that her friend was attempting to use motivational quote to cheer herself up but states that this did not work.  [name] states her friend told her that she was doing the therapy and participating  in the homework assignments from the therapist but believes that the isolation became to much. [name] states that her friend has been talking to her mother about moving closer to her in [place] but the friend is not sure she wants to be that close to her mother.  [name] states that she will be calling and checking in on her friend regularly and going to her dorm room if she does not answer or seems depressed.  [name] states there are no firearms or weapons in the home.  CSW completed SPE with Krista Schroeder.   Krista Schroeder Krista Schroeder 10/09/2020, 10:01 AM
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
## 2020-10-09 NOTE — Progress Notes (Signed)
Recreation Therapy Notes  Date:  3.28.22 Time: 0930 Location: [address] Dayroom  Group Topic: Stress Management  Goal Area(s) Addresses:  Patient will identify positive stress management techniques. Patient will identify benefits of using stress management post d/c.  Behavioral Response:  Engaged  Intervention: Stress Management  Activity: Meditation.  LRT played a meditation that got the patients to take inventory of how their bodies and any tension or pain they may be feeling and try ease by focusing and using breathing techniques.  Patients were to focus as the meditation played to fully engage in the activity.    Education:  Stress Management, Discharge Planning.   Education Outcome: Acknowledges Education  Clinical Observations/Feedback: Pt attended and participated in group session.    Marjette Lindsay, LRT/CTRS         Lindsay, Marjette A 10/09/2020 11:46 AM 

## 2020-10-09 NOTE — Discharge Summary (Signed)
Physician Discharge Summary Note  Patient:  Krista Schroeder is an 25 y.o., female MRN:  [id number] DOB:  1995/12/28 Patient phone:  [phone] (home)  Patient address:   [address] [place] [address],  Total Time spent with patient: 30 minutes  Date of Admission:  10/06/2020 Date of Discharge: 10/09/2020  Reason for Admission:  (From MD's admission note): This is the first psychiatric admission for Krista Schroeder, a 25 year old AA female with previous hx of depression & anxiety. 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 She is admitted to the [facility] from the [facility] ED with complain of suicidal ideations, suicide attempt by self-inflicted superficial laceration to inner left wrist. After medically evaluated/cleared at the ED, she was sent to the [facility] for psychiatric evaluation & treatment. During this evaluation, Camry reports,  "My friend took me to the ED yesterday. I had made a suicide attempt a couple of hours prior. I slit my right wrist with a knife. I have been having suicidal ideations since last September, 2021. I have been grieving a lot & feeling overwhelmed dealing with my family. I know that my family cares about me, but in my head, I'm thinking they do not understand what I'm going through or my depression. I have feeling depressed since 2017. It started with bad anxiety, but no one in my family understands. Prior to starting college, my cousin died of breast cancer. After that, my friend died with her 3 other family memebrs. They were shot to death by her brother. I have been grieving hard. Then, my paternal grandmother died as well. I was treated for depression sometime last year. I was given. SRRI. I took it for a month, it made me feel weird, I stopped taking it. After I stopped the medicine, all the emotions came back & worsened. I recently heard that someone I knew from high school committed suicide. I do not use drugs or alcohol".  Patient at this time rates her  depression #2 & anxiety #1.  Evaluation on the unit, day of discharge: Patient was seen and evaluated. Patient denies SI/HI/AVH, paranoia and delusions. Patient reported good sleep and is eating well. Patient is taking her medications as prescribed and has no complaints of side effects. Patient is attending group therapy and interacts appropriately with staff and peers. Patient stated she is feeling better than when she was admitted and agrees to take her medication and attend her follow up appointments. Patient is stable for discharge home today.   Principal Problem: MDD (major depressive disorder), recurrent episode, severe (HCC) Discharge Diagnoses: Principal Problem:   MDD (major depressive disorder), recurrent episode, severe (HCC) Active Problems:   Major depressive disorder, recurrent episode (HCC)   Past Psychiatric History: See H&P  Past Medical History:  Past Medical History:  Diagnosis Date  . Depression     Past Surgical History:  Procedure Laterality Date  . NO PAST SURGERIES     Family History:  Family History  Problem Relation Age of Onset  . Healthy Mother   . Diabetes Father    Family Psychiatric  History: See H&P Social History:  Social History   Substance and Sexual Activity  Alcohol Use Yes   Comment: occasionally, once every few months, 1/2 cup in amount, last drink was Saturday     Social History   Substance and Sexual Activity  Drug Use Never    Social History   Socioeconomic History  . Marital status:  Single    Spouse name: Not on file  . Number of children: Not on file  . Years of education: Not on file  . Highest education level: Not on file  Occupational History  . Not on file  Tobacco Use  . Smoking status: Never Smoker  . Smokeless tobacco: Never Used  Vaping Use  . Vaping Use: Never used  Substance and Sexual Activity  . Alcohol use: Yes    Comment: occasionally, once every few months, 1/2 cup in amount, last drink was Saturday   . Drug use: Never  . Sexual activity: Not Currently    Birth control/protection: None  Other Topics Concern  . Not on file  Social History Narrative  . Not on file   Social Determinants of Health   Financial Resource Strain: Not on file  Food Insecurity: Not on file  Transportation Needs: Not on file  Physical Activity: Not on file  Stress: Not on file  Social Connections: Not on file    Hospital Course:  After the above admission evaluation, Adler's  presenting symptoms were noted. She was recommended for mood stabilization treatments. The medication regimen targeting those presenting symptoms were discussed with her & initiated with her consent. Her UDS and BAL on arrival to the ED were negative. She was however medicated, stabilized & discharged on the medications as listed on her discharge medication lists below. Besides the mood stabilization treatments, Lekeisha was also enrolled & participated in the group counseling sessions being offered & held on this unit. She learned coping skills. She presented no other significant pre-existing medical issues that required treatment. She tolerated his treatment regimen without any adverse effects or reactions reported.   During the course of her hospitalization, the 15-minute checks were adequate to ensure patient's safety. Taqwa did not display any dangerous, violent or suicidal behavior on the unit.  She interacted with patients & staff appropriately, participated appropriately in the group sessions/therapies. Her medications were addressed & adjusted to meet her needs. She was recommended for outpatient follow-up care & medication management upon discharge to assure continuity of care & mood stability.  At the time of discharge patient is not reporting any acute suicidal/homicidal ideations. She feels more confident about her self-care & in managing his mental health. She currently denies any new issues or concerns. Education and supportive  counseling provided throughout his/her hospital stay & upon discharge.   Today upon her discharge evaluation with the attending psychiatrist, Asya shares she is doing well. She denies any other specific concerns. She is sleeping well. Her appetite is good. She denies other physical complaints. She denies AH/VH, delusional thoughts or paranoia. She does not appear to be responding to any internal stimuli. She feels that his/her medications have been helpful & is in agreement to continue her current treatment regimen as recommended. She was able to engage in safety planning including plan to return to [facility] or contact emergency services if she feels unable to maintain her own safety or the safety of others. Pt had no further questions, comments, or concerns. She left [facility] with all personal belongings in no apparent distress. Transportation home per her family.   Physical Findings: AIMS: Facial and Oral Movements Muscles of Facial Expression: None, normal Lips and Perioral Area: None, normal Jaw: None, normal Tongue: None, normal,Extremity Movements Upper (arms, wrists, hands, fingers): None, normal Lower (legs, knees, ankles, toes): None, normal, Trunk Movements Neck, shoulders, hips: None, normal, Overall Severity Severity of abnormal movements (highest score from  questions above): None, normal Incapacitation due to abnormal movements: None, normal Patient's awareness of abnormal movements (rate only patient's report): No Awareness, Dental Status Current problems with teeth and/or dentures?: No Does patient usually wear dentures?: No  CIWA:  CIWA-Ar Total: 1 COWS:  COWS Total Score: 2  Musculoskeletal: Strength & Muscle Tone: within normal limits Gait & Station: normal Patient leans: N/A  Psychiatric Specialty Exam:  Presentation  General Appearance: Appropriate for Environment; Fairly Groomed; Well Groomed  Eye Contact:Good  Speech:Clear and Coherent; Normal Rate  Speech  Volume:Normal  Handedness:Left  Mood and Affect  Mood:Euthymic ("I'm doing pretty good".)  Affect:Congruent; Constricted  Thought Process  Thought Processes:Coherent; Goal Directed  Descriptions of Associations:Intact  Orientation:Full (Time, Place and Person)  Thought Content:Logical  History of Schizophrenia/Schizoaffective disorder:No  Duration of Psychotic Symptoms:No data recorded Hallucinations:No data recorded Ideas of Reference:None  Suicidal Thoughts:No data recorded Homicidal Thoughts:No data recorded  Sensorium  Memory:Immediate Good; Recent Good; Remote Good  Judgment:Fair ("Improving")  Insight:Fair  Executive Functions  Concentration:Good  Attention Span:Good  Recall:Good  Fund of Knowledge:Good  Language:Good  Psychomotor Activity  Psychomotor Activity:No data recorded  Assets  Assets:Communication Skills; Desire for Improvement; Physical Health; Social Support  Sleep  Sleep:No data recorded  Physical Exam: Physical Exam Vitals and nursing note reviewed.  Constitutional:      Appearance: Normal appearance.  HENT:     Head: Normocephalic.  Pulmonary:     Effort: Pulmonary effort is normal.  Musculoskeletal:        General: Normal range of motion.     Cervical back: Normal range of motion.  Neurological:     General: No focal deficit present.     Mental Status: She is alert and oriented to person, place, and time.  Psychiatric:        Attention and Perception: Attention normal. She does not perceive auditory or visual hallucinations.        Mood and Affect: Mood normal.        Speech: Speech normal.        Behavior: Behavior normal. Behavior is cooperative.        Thought Content: Thought content normal. Thought content is not paranoid or delusional. Thought content does not include homicidal or suicidal ideation. Thought content does not include homicidal or suicidal plan.        Cognition and Memory: Cognition normal.     Review of Systems  Constitutional: Negative for fever.  HENT: Negative for congestion and sore throat.   Respiratory: Negative for cough, shortness of breath and wheezing.   Cardiovascular: Negative.   Gastrointestinal: Negative.   Genitourinary: Negative.   Musculoskeletal: Negative.   Neurological: Negative.    Blood pressure 104/69, pulse 100, temperature 98.1 F (36.7 C), temperature source Oral, resp. rate 18, height 5\' 3"  (1.6 m), weight 97.1 kg, last menstrual period 09/27/2020, SpO2 100 %. Body mass index is 37.91 kg/m.   Have you used any form of tobacco in the last 30 days? (Cigarettes, Smokeless Tobacco, Cigars, and/or Pipes): No  Has this patient used any form of tobacco in the last 30 days? (Cigarettes, Smokeless Tobacco, Cigars, and/or Pipes) Yes, N/A  Blood Alcohol level:  Lab Results  Component Value Date   ETH <10 10/05/2020    Metabolic Disorder Labs:  Lab Results  Component Value Date   HGBA1C 5.6 10/07/2020   MPG 114.02 10/07/2020   No results found for: PROLACTIN Lab Results  Component Value Date   CHOL 165 10/07/2020  TRIG 206 (H) 10/07/2020   HDL 54 10/07/2020   CHOLHDL 3.1 10/07/2020   VLDL 41 (H) 10/07/2020   LDLCALC 70 10/07/2020    See Psychiatric Specialty Exam and Suicide Risk Assessment completed by Attending Physician prior to discharge.  Discharge destination:  Home  Is patient on multiple antipsychotic therapies at discharge:  No   Has Patient had three or more failed trials of antipsychotic monotherapy by history:  No  Recommended Plan for Multiple Antipsychotic Therapies: NA  Discharge Instructions    Diet - low sodium heart healthy   Complete by: As directed    Increase activity slowly   Complete by: As directed      Allergies as of 10/09/2020   No Known Allergies     Medication List    TAKE these medications     Indication  escitalopram 10 MG tablet Commonly known as: LEXAPRO Take 1 tablet (10 mg total) by  mouth daily. Start taking on: October 10, 2020  Indication: Generalized Anxiety Disorder, Major Depressive Disorder   hydrOXYzine 25 MG tablet Commonly known as: ATARAX/VISTARIL Take 1 tablet (25 mg total) by mouth 3 (three) times daily as needed for anxiety.  Indication: Feeling Anxious       Follow-up Information    [facility]. Call.   Why: You may call this provider for bereavement counseling services. * Please call # [phone] to schedule an appointment after your discharge. Contact information: [address] [facility] [place] [address] [phone]       Burchette, Santiago Bumpers, LCAS. Go on 10/18/2020.   Why: You have an appointment on 10/18/20 at 9:00 am.  This appointment will be held in person. Contact information: [address] [place] [address] [phone]        [facility]. Go on 10/13/2020.   Why: You have an appointment on Friday 10/13/20 at 10:00am IN PERSON with your counselor Miss Everardo All. Contact information: [address], [address] [place], [place] [address] Phone: [phone] Fax: [phone]              Follow-up recommendations:  Activity:  as tolerated Diet:  Heart healthy  Comments:  Prescriptions given at discharge.  Patient agreeable to plan.  Given opportunity to ask questions.  Appears to feel comfortable with discharge denies any current suicidal or homicidal thoughts.   Patient is instructed prior to discharge to: Take all medications as prescribed by her mental healthcare provider. Report any adverse effects and or reactions from the medicines to her outpatient provider promptly. Patient has been instructed & cautioned: To not engage in alcohol and or illegal drug use while on prescription medicines. In the event of worsening symptoms, patient is instructed to call the crisis hotline, 911 and or go to the nearest ED for appropriate evaluation and treatment of symptoms. To follow-up with her primary  care provider for your other medical issues, concerns and or health care needs.   Signed: Laveda Abbe, NP 10/09/2020, 10:15 AM

## 2020-10-09 NOTE — Plan of Care (Signed)
  Problem: Physical Regulation: Goal: Ability to maintain clinical measurements within normal limits will improve Outcome: Progressing   Problem: Safety: Goal: Periods of time without injury will increase Outcome: Progressing   Problem: Education: Goal: Utilization of techniques to improve thought processes will improve Outcome: Progressing Goal: Knowledge of the prescribed therapeutic regimen will improve Outcome: Progressing

## 2020-10-09 NOTE — Plan of Care (Signed)
Discharge note  Patient verbalizes readiness for discharge. Follow up plan explained, AVS, Transition record and SRA given. Prescriptions and teaching provided. Belongings returned and signed for. Suicide safety plan completed and signed. Patient verbalizes understanding. Patient denies SI/HI and assures this Clinical research associate they will seek assistance should that change. Patient discharged to lobby where mother was waiting.  Problem: Education: Goal: Knowledge of [facility] Education information/materials will improve Outcome: Adequate for Discharge Goal: Emotional status will improve Outcome: Adequate for Discharge Goal: Mental status will improve Outcome: Adequate for Discharge Goal: Verbalization of understanding the information provided will improve Outcome: Adequate for Discharge   Problem: Activity: Goal: Interest or engagement in activities will improve Outcome: Adequate for Discharge Goal: Sleeping patterns will improve Outcome: Adequate for Discharge   Problem: Coping: Goal: Ability to verbalize frustrations and anger appropriately will improve Outcome: Adequate for Discharge Goal: Ability to demonstrate self-control will improve Outcome: Adequate for Discharge   Problem: Health Behavior/Discharge Planning: Goal: Identification of resources available to assist in meeting health care needs will improve Outcome: Adequate for Discharge Goal: Compliance with treatment plan for underlying cause of condition will improve Outcome: Adequate for Discharge   Problem: Physical Regulation: Goal: Ability to maintain clinical measurements within normal limits will improve 10/09/2020 1311 by Raylene Miyamoto, RN Outcome: Adequate for Discharge 10/09/2020 0906 by Raylene Miyamoto, RN Outcome: Progressing   Problem: Safety: Goal: Periods of time without injury will increase 10/09/2020 1311 by Raylene Miyamoto, RN Outcome: Adequate for Discharge 10/09/2020 0906 by Raylene Miyamoto, RN Outcome: Progressing   Problem: Education: Goal: Utilization of techniques to improve thought processes will improve 10/09/2020 1311 by Raylene Miyamoto, RN Outcome: Adequate for Discharge 10/09/2020 7494 by Raylene Miyamoto, RN Outcome: Progressing Goal: Knowledge of the prescribed therapeutic regimen will improve 10/09/2020 1311 by Raylene Miyamoto, RN Outcome: Adequate for Discharge 10/09/2020 4967 by Raylene Miyamoto, RN Outcome: Progressing   Problem: Activity: Goal: Interest or engagement in leisure activities will improve Outcome: Adequate for Discharge Goal: Imbalance in normal sleep/wake cycle will improve Outcome: Adequate for Discharge   Problem: Coping: Goal: Coping ability will improve Outcome: Adequate for Discharge Goal: Will verbalize feelings Outcome: Adequate for Discharge   Problem: Health Behavior/Discharge Planning: Goal: Ability to make decisions will improve Outcome: Adequate for Discharge Goal: Compliance with therapeutic regimen will improve Outcome: Adequate for Discharge   Problem: Role Relationship: Goal: Will demonstrate positive changes in social behaviors and relationships Outcome: Adequate for Discharge   Problem: Safety: Goal: Ability to disclose and discuss suicidal ideas will improve Outcome: Adequate for Discharge Goal: Ability to identify and utilize support systems that promote safety will improve Outcome: Adequate for Discharge   Problem: Self-Concept: Goal: Will verbalize positive feelings about self Outcome: Adequate for Discharge Goal: Level of anxiety will decrease Outcome: Adequate for Discharge   Problem: Education: Goal: Ability to make informed decisions regarding treatment will improve Outcome: Adequate for Discharge   Problem: Coping: Goal: Coping ability will improve Outcome: Adequate for Discharge   Problem: Health Behavior/Discharge Planning: Goal: Identification of resources available to assist in  meeting health care needs will improve Outcome: Adequate for Discharge   Problem: Medication: Goal: Compliance with prescribed medication regimen will improve Outcome: Adequate for Discharge   Problem: Self-Concept: Goal: Ability to disclose and discuss suicidal ideas will improve Outcome: Adequate for Discharge Goal: Will verbalize positive feelings about self Outcome: Adequate for Discharge   Problem: Education: Goal: Ability to state activities that  reduce stress will improve Outcome: Adequate for Discharge   Problem: Coping: Goal: Ability to identify and develop effective coping behavior will improve Outcome: Adequate for Discharge   Problem: Self-Concept: Goal: Ability to identify factors that promote anxiety will improve Outcome: Adequate for Discharge Goal: Level of anxiety will decrease Outcome: Adequate for Discharge Goal: Ability to modify response to factors that promote anxiety will improve Outcome: Adequate for Discharge

## 2020-10-09 NOTE — Progress Notes (Signed)
  [facility] Adult Case Management Discharge Plan :  Will you be returning to the same living situation after discharge:  Yes,  Home At discharge, do you have transportation home?: Yes,  Family  Do you have the ability to pay for your medications: Yes,  Insurance   Release of information consent forms completed and in the chart;  Patient's signature needed at discharge.  Patient to Follow up at:  Follow-up Information    [facility]. Call.   Why: You may call this provider for bereavement counseling services. * Please call # [phone] to schedule an appointment after your discharge. Contact information: [address] [facility] [place] [address] [phone]       Burchette, Santiago Bumpers, LCAS. Go on 10/18/2020.   Why: You have an appointment on 10/18/20 at 9:00 am.  This appointment will be held in person. Contact information: [address] [place] [address] [phone]        [facility]. Go on 10/13/2020.   Why: You have an appointment on Friday 10/13/20 at 10:00am IN PERSON with your counselor Miss Everardo All. Contact information: [address], [address] [place], [place] [address] Phone: [phone] Fax: [phone]              Next level of care provider has access to [facility] Link:no  Safety Planning and Suicide Prevention discussed: Yes,  with friend and patient   Have you used any form of tobacco in the last 30 days? (Cigarettes, Smokeless Tobacco, Cigars, and/or Pipes): No  Has patient been referred to the Quitline?: N/A patient is not a smoker  Patient has been referred for addiction treatment: N/A  Aram Beecham, LCSWA 10/09/2020, 9:58 AM

## 2021-05-13 IMAGING — DX DG ELBOW COMPLETE 3+V*R*
2 series · 2 of 2 positions shown · non-contrast
Comparison: None.

CLINICAL DATA: Right elbow pain after fall

EXAM:
RIGHT ELBOW - COMPLETE 3+ VIEW

[elbow obl]
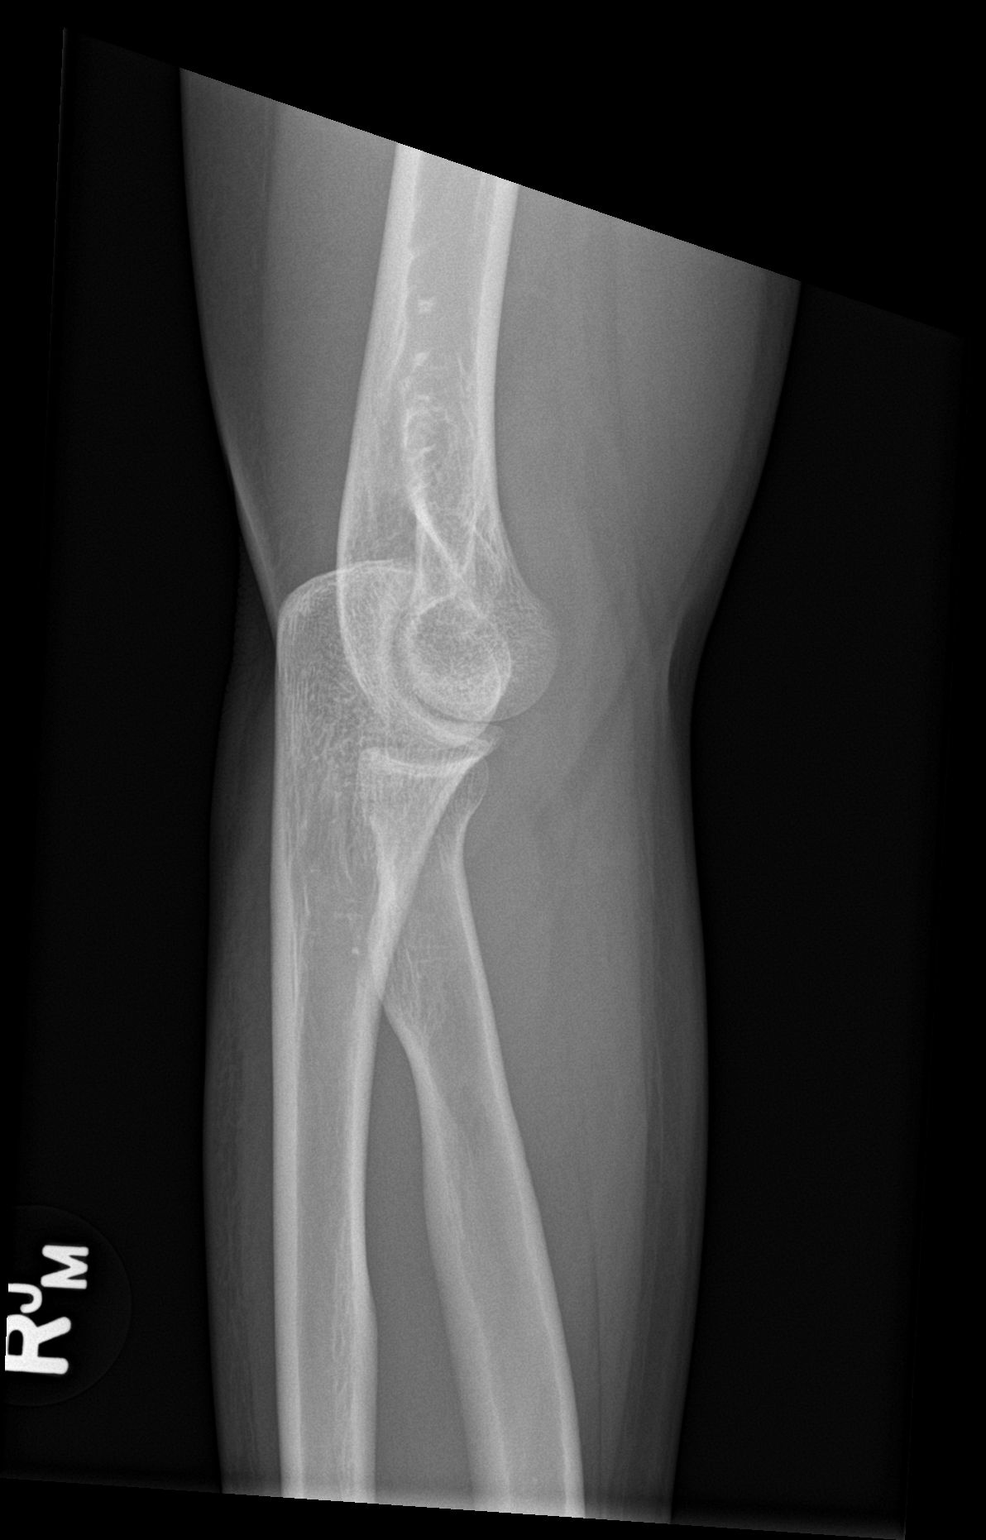

[elbow lat]
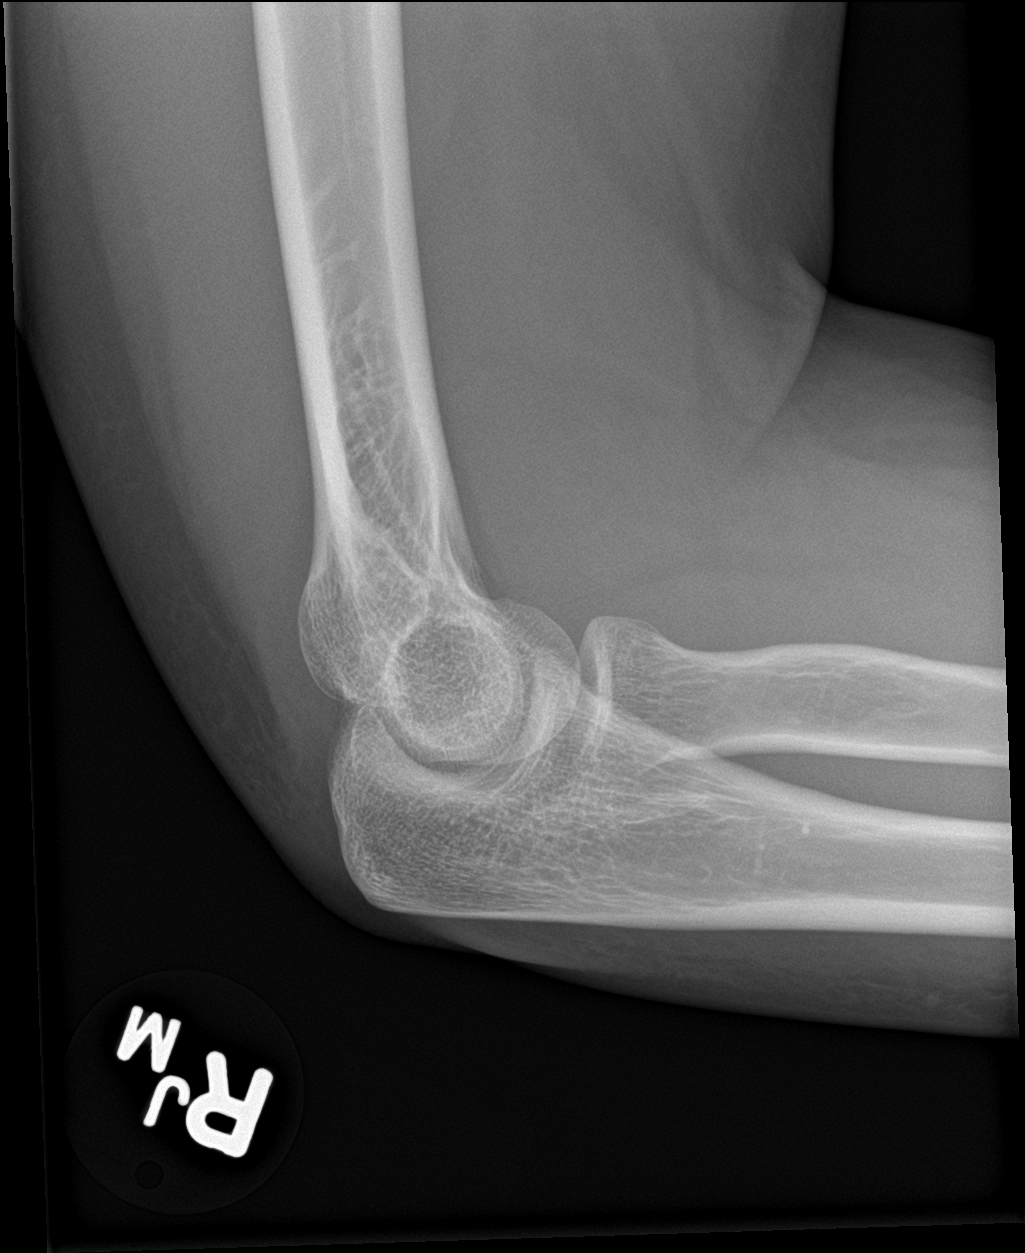

[2 of 2 positions shown; findings below may reference images not displayed]

FINDINGS: There is no evidence of fracture, dislocation, or joint effusion.
There is no evidence of arthropathy or other focal bone abnormality.
Soft tissues are unremarkable.
IMPRESSION: Negative.
